# Patient Record
Sex: Male | Born: 1950
Health system: Southern US, Community
[De-identification: ages and names within clinical notes are randomized; demographics above are authoritative.]

## PROBLEM LIST (undated history)

## (undated) DIAGNOSIS — I639 Cerebral infarction, unspecified: Secondary | ICD-10-CM

## (undated) DIAGNOSIS — F419 Anxiety disorder, unspecified: Secondary | ICD-10-CM

## (undated) DIAGNOSIS — I10 Essential (primary) hypertension: Secondary | ICD-10-CM

## (undated) HISTORY — PX: APPENDECTOMY: SHX54

## (undated) HISTORY — PX: NASAL SINUS SURGERY: SHX719

## (undated) HISTORY — DX: Cerebral infarction, unspecified: I63.9

## (undated) HISTORY — DX: Anxiety disorder, unspecified: F41.9

---

## 1999-10-19 ENCOUNTER — Ambulatory Visit: Admission: RE | Admit: 1999-10-19 | Discharge: 1999-10-19 | Payer: Self-pay | Admitting: Internal Medicine

## 1999-10-19 ENCOUNTER — Encounter: Payer: Self-pay | Admitting: Pulmonary Disease

## 2002-07-13 ENCOUNTER — Encounter (INDEPENDENT_AMBULATORY_CARE_PROVIDER_SITE_OTHER): Payer: Self-pay | Admitting: *Deleted

## 2002-07-13 ENCOUNTER — Ambulatory Visit (HOSPITAL_COMMUNITY): Admission: RE | Admit: 2002-07-13 | Discharge: 2002-07-13 | Payer: Self-pay | Admitting: Gastroenterology

## 2007-10-24 ENCOUNTER — Ambulatory Visit: Payer: Self-pay | Admitting: Sports Medicine

## 2007-10-24 DIAGNOSIS — M2020 Hallux rigidus, unspecified foot: Secondary | ICD-10-CM

## 2007-10-24 DIAGNOSIS — M79609 Pain in unspecified limb: Secondary | ICD-10-CM

## 2007-10-25 ENCOUNTER — Encounter: Payer: Self-pay | Admitting: Sports Medicine

## 2007-12-01 ENCOUNTER — Ambulatory Visit: Payer: Self-pay | Admitting: Pulmonary Disease

## 2007-12-01 DIAGNOSIS — J309 Allergic rhinitis, unspecified: Secondary | ICD-10-CM | POA: Insufficient documentation

## 2007-12-01 DIAGNOSIS — G4733 Obstructive sleep apnea (adult) (pediatric): Secondary | ICD-10-CM

## 2007-12-01 DIAGNOSIS — R519 Headache, unspecified: Secondary | ICD-10-CM | POA: Insufficient documentation

## 2007-12-01 DIAGNOSIS — R51 Headache: Secondary | ICD-10-CM | POA: Insufficient documentation

## 2007-12-01 DIAGNOSIS — G4700 Insomnia, unspecified: Secondary | ICD-10-CM

## 2007-12-14 ENCOUNTER — Encounter: Admission: RE | Admit: 2007-12-14 | Discharge: 2007-12-14 | Payer: Self-pay | Admitting: Otolaryngology

## 2007-12-20 ENCOUNTER — Telehealth: Payer: Self-pay | Admitting: Pulmonary Disease

## 2008-01-02 ENCOUNTER — Telehealth (INDEPENDENT_AMBULATORY_CARE_PROVIDER_SITE_OTHER): Payer: Self-pay | Admitting: *Deleted

## 2008-01-02 ENCOUNTER — Encounter: Payer: Self-pay | Admitting: Pulmonary Disease

## 2008-02-02 ENCOUNTER — Ambulatory Visit: Payer: Self-pay | Admitting: Sports Medicine

## 2008-02-08 ENCOUNTER — Ambulatory Visit: Payer: Self-pay | Admitting: Pulmonary Disease

## 2008-02-19 ENCOUNTER — Telehealth: Payer: Self-pay | Admitting: Pulmonary Disease

## 2008-02-29 ENCOUNTER — Telehealth (INDEPENDENT_AMBULATORY_CARE_PROVIDER_SITE_OTHER): Payer: Self-pay | Admitting: *Deleted

## 2008-03-14 ENCOUNTER — Ambulatory Visit: Payer: Self-pay | Admitting: Pulmonary Disease

## 2008-03-28 ENCOUNTER — Telehealth (INDEPENDENT_AMBULATORY_CARE_PROVIDER_SITE_OTHER): Payer: Self-pay | Admitting: *Deleted

## 2008-04-15 ENCOUNTER — Telehealth (INDEPENDENT_AMBULATORY_CARE_PROVIDER_SITE_OTHER): Payer: Self-pay | Admitting: *Deleted

## 2008-04-16 ENCOUNTER — Telehealth (INDEPENDENT_AMBULATORY_CARE_PROVIDER_SITE_OTHER): Payer: Self-pay | Admitting: *Deleted

## 2008-05-09 ENCOUNTER — Ambulatory Visit: Payer: Self-pay | Admitting: Pulmonary Disease

## 2008-05-15 ENCOUNTER — Encounter (INDEPENDENT_AMBULATORY_CARE_PROVIDER_SITE_OTHER): Payer: Self-pay | Admitting: Otolaryngology

## 2008-05-15 ENCOUNTER — Ambulatory Visit (HOSPITAL_COMMUNITY): Admission: RE | Admit: 2008-05-15 | Discharge: 2008-05-16 | Payer: Self-pay | Admitting: Otolaryngology

## 2008-06-27 ENCOUNTER — Telehealth (INDEPENDENT_AMBULATORY_CARE_PROVIDER_SITE_OTHER): Payer: Self-pay | Admitting: *Deleted

## 2008-07-08 ENCOUNTER — Telehealth (INDEPENDENT_AMBULATORY_CARE_PROVIDER_SITE_OTHER): Payer: Self-pay | Admitting: *Deleted

## 2008-08-28 ENCOUNTER — Ambulatory Visit (HOSPITAL_BASED_OUTPATIENT_CLINIC_OR_DEPARTMENT_OTHER): Admission: RE | Admit: 2008-08-28 | Discharge: 2008-08-28 | Payer: Self-pay | Admitting: Otolaryngology

## 2008-08-31 ENCOUNTER — Ambulatory Visit: Payer: Self-pay | Admitting: Internal Medicine

## 2008-10-03 ENCOUNTER — Encounter: Payer: Self-pay | Admitting: Pulmonary Disease

## 2008-11-07 ENCOUNTER — Telehealth (INDEPENDENT_AMBULATORY_CARE_PROVIDER_SITE_OTHER): Payer: Self-pay | Admitting: *Deleted

## 2008-11-13 ENCOUNTER — Ambulatory Visit: Payer: Self-pay | Admitting: Pulmonary Disease

## 2008-11-27 ENCOUNTER — Encounter: Payer: Self-pay | Admitting: Pulmonary Disease

## 2008-12-24 ENCOUNTER — Encounter: Payer: Self-pay | Admitting: Pulmonary Disease

## 2011-01-19 NOTE — Op Note (Signed)
Omar Reed, Omar Reed                ACCOUNT NO.:  [id number]   MEDICAL RECORD NO.:  [id number]          PATIENT TYPE:  OIB   LOCATION:  2623                         FACILITY:  [facility]   PHYSICIAN:  Kinnie Scales. Annalee Genta, M.D.DATE OF BIRTH:  June 04, 1951   DATE OF PROCEDURE:  05/15/2008  DATE OF DISCHARGE:                               OPERATIVE REPORT   LOCATION:  [facility] Main OR.   PREOPERATIVE DIAGNOSES:  1. Chronic sinusitis.  2. Deviated nasal septum with nasal airway obstruction.  3. Inferior turbinate hypertrophy.   POSTOPERATIVE DIAGNOSES:  1. Chronic sinusitis.  2. Deviated nasal septum with nasal airway obstruction.  3. Inferior turbinate hypertrophy.   INDICATIONS FOR SURGERY:  1. Chronic sinusitis.  2. Deviated nasal septum with nasal airway obstruction.  3. Inferior turbinate hypertrophy.   SURGICAL PROCEDURES:  1. Bilateral endoscopic sinus surgery with computer-assisted      navigation (stealth format consisting of bilateral total      ethmoidectomy, bilateral maxillary antrostomy with removal of      diseased tissue and bilateral nasal frontal recess exploration.  2. Nasal septoplasty.  3. Bilateral inferior turbinate reduction.   SURGEON:  Kinnie Scales. Annalee Genta, MD   ANESTHESIA:  General endotracheal.   COMPLICATIONS:  None.   ESTIMATED BLOOD LOSS:  150 mL.   The patient was transferred from the operating room to recovery room in  stable condition.   BRIEF HISTORY:  [name] is a 60 year old white male who has been  followed in our office with a history of chronic sinusitis and  obstructive sleep apnea.  The patient had been previously treated with  multiple courses of antibiotics and had initial improvement after  treatment, but then shortly thereafter recurred with recurrent purulent  nasal drainage, headache, and severe and progressive nasal congestion.  CT scanning was performed.  The patient was found have a severely  deviated septum with  near complete right-sided nasal airway obstruction  and bilateral chronic-appearing sinonasal disease.  The patient was  counseled regarding surgical procedures for the above problems.  Given  his history of obstructive sleep apnea surgery was anticipated at [facility] Main OR with overnight observation in step-down for  postoperative sleep apnea monitoring.  The risks, benefits, and possible  complications of septoplasty, turbinate reduction, and bilateral  endoscopic sinus surgery were discussed in detail with the patient's and  his wife who understood and concurred with our plan for surgery, which  was scheduled as above.  Prior to surgical intervention, a stealth  formatted CT scan of the sinus was obtained for intraoperative computer-  assisted navigation and the stealth device was used throughout the  surgical procedure.   SURGICAL PROCEDURE:  The patient was brought to the operating room on  May 15, 2008 at [facility] Main OR, placed in supine  position on the operating table.  General endotracheal anesthesia was  established without difficulty.  When the patient was adequately  anesthetized, the patient was positioned on the operating table and  prepped and draped in the sterile fashion.  The patient's nose was  injected with a total of 12 mL of 1% lidocaine with 1:100,000 solution  of epinephrine which was injected in a submucosal fashion along the  lateral nasal wall, uncinate process, middle turbinate, inferior  turbinate, and nasal septum.  The patient's nose was then packed with  Afrin-soaked cottonoid pledgets, we had to place it for approximately 10  minutes to allow for vasoconstriction.  The stealth headgear was then  applied and anatomic surgical landmarks were identified confirmed using  the stealth device, which was used throughout the procedure.   Sinus surgery begun on the patient's left-hand side, packing was  removed, 0-degree nasal  endoscopy was performed.  The patient had a  large middle turbinate concha bullosa, which was resected by creating a  vertical incision in the anterior face of the middle turbinate, this was  carried anterior to posterior and the lateral aspect of the concha  bullosa was resected preserving the mucosa and middle turbinate  structure.  Attention was then turned to the ethmoid cavity where  anterior ethmoidectomy was performed by resecting the ethmoid bulla and  using a 0-degree telescope and a straight microdebrider for dissection  throughout the ethmoids.  Dissection was then carried posteriorly and  posterior ethmoid air cells were identified using a 45-degree telescope  and the curved microdebrider.  Dissection was then carried out along the  roof of the ethmoid from posterior to anterior.  The stealth navigation  tool was used throughout this portion of the procedure.  The nasal  frontal recess was identified and was completely occluded by underlying  ethmoid air cells and thickened diseased mucosa, this was resected with  through-cutting forceps and a curved microdebrider creating a widely  patent nasal frontal recess.  Ethmoid septations were resected and this  created a patent common ethmoid cavity.  Attention was then turned to  the lateral nasal wall where the natural ostium of the maxillary sinus  was identified, again this was completely occluded by diseased mucosa  and overlying uncinate, which was resected.  The natural ostium of the  maxillary sinus was then enlarged in anterior, inferior, and posterior  direction and enlarged maxillary sinus mucous retention cyst was then  resected with a curved microdebrider.   Attention was then turned to the patient's nasal septum.  The left  anterior hemitransfixion incision was created and a mucoperichondrial  flap was elevated from anterior to posterior along the patient's left-  hand side.  Bony cartilaginous junction was crossed in  the midline and  mucoperiosteal flap was elevated on the right, anterior and midseptal  cartilage was resected, morselized and returned to the mucoperichondrial  pocket at the conclusion of the surgical procedure.  Anterior, dorsal,  and columellar cartilage was not deviated and was not implemented during  the operation.  Posterior septal spurring and obstruction was then  dissected and removed with through-cutting forceps.  The patient had a  large right inferior septal spur, which was also resected after  elevating and preserving the overlying mucosa, it was brought the septum  to the midline.  After conclusion of the procedure, the morselized  cartilage was returned to the mucoperichondrial pocket and the flaps  were re-approximated with a 4-0 gut suture on a Keith needle in a  horizontal mattress fashion.  Bilateral Doyle nasal septal splints were  then placed after the application of Bactroban ointment over the  suturing position with a 3-0 Ethilon suture for stability.   With the septal surgery completed, right endoscopic sinus  surgery was  undertaken using a 0-degree telescope.  The middle turbinate was gently  medialized.  Because of the severely deviated septum, the entire middle  turbinate and right middle meatus were occluded with polypoid diseased  mucosa.  With the middle turbinate returned to a normal anatomic  position, anterior ethmoidectomy was performed with resection of the  ethmoid bulla.  Dissection was then carried from anterior to posterior  removing bony septations.  Posterior superior ethmoid cells were  identified and there was significant mucosal thickening in this area  consistent with chronic infection.  No pus was noted.  Dissection was  then carried from posterior to anterior along the roof of the ethmoid  sinus with a 45-degree telescope, curved microdebrider, and stealth  suction probe.  Nasal frontal recess was again occluded from below.  The  anterior  ethmoid air cells were resected.  Dissection was then carried  out into the nasal frontal recess preserving the mucosa by creating a  widely patent nasal frontal recess.  Again, the mucosa within the  frontal sinus was significantly thickened consistent with chronic  sinusitis, but there was no pus.  Attention was then turned to lateral  nasal wall where residual uncinate process was reflected anteriorly and  resected.  Natural ostium of the sinus was identified and then the sinus  ostium was enlarged in a posterior inferior direction.  Again within the  maxillary sinus, there was a relatively large mucous cyst, which was  resected creating a widely patent right maxillary sinus.   Attention was then turned to the inferior turbinates where bilateral  inferior turbinate reduction was performed with cautery set at 12 watts,  2 submucosal passes were made with bipolar intramural cautery.  With  soft tissue turbinates cauterized, turbinates were then outfractured.  Small anterior incisions were created, overlying mucosa was elevated,  and small amount turbinate bone was resected bilaterally creating a  widely patent nasal cavity.   The patient's nasal cavity was then thoroughly inspected, surgical  debris was resected, and the sinuses were irrigated.  There was minimal  active bleeding and sinuses were widely patent.  Bilateral Kennedy sinus  packs were then placed after the application of Bactroban ointment,  placed within the middle meatus and common ethmoid cavity bilaterally.  The patient's oral cavity and oropharynx were then irrigated and  suctioned and orogastric tube was passed.  Stomach contents were  aspirated.  The patient was awakened from his anesthetic, he was  extubated, and was transferred from the operating room to the recovery  room in stable condition.  There were no complications and blood loss  was approximately 150 mL.           ______________________________  Kinnie Scales. Annalee Genta, M.D.     DLS/MEDQ  D:  16/06/9603  T:  05/16/2008  Job:  [id number]

## 2011-01-19 NOTE — Procedures (Signed)
NAME:  Omar Reed, Omar Reed                ACCOUNT NO.:  [id number]   MEDICAL RECORD NO.:  [id number]          PATIENT TYPE:  OUT   LOCATION:  SLEEP CENTER                 [facility]ACILITY[facility]  [facility]   PHYSICIAN:  Clinton D. Maple Hudson, MD, FCCP, FACPDATE OF BIRTH:  09/25/1950   DATE OF STUDY:  08/28/2008                            NOCTURNAL POLYSOMNOGRAM   REFERRING PHYSICIAN:  Onalee Hua L. Annalee Genta, M.D.   INDICATION FOR STUDY:  Hypersomnia with sleep apnea.   EPWORTH SLEEPINESS SCORE:  Epworth sleepiness score 9/24, BMI 23.8,  weight 143 pounds, height 65 inches, and neck 13.5 inches.   MEDICATIONS:  Home medications are charted and reviewed.  This is a  postsurgical study.   SLEEP ARCHITECTURE:  Split study protocol.  During the diagnostic phase,  total sleep time 139 minutes with sleep efficiency 94.9%.  Stage I is  6.5%, stage II 91.8%, stage III absent.  REM 1.8% of total sleep time.  Sleep latency is 2 minutes.  REM latency 136 minutes.  Awake after sleep  onset 3 minutes.  Arousal index 27.1.  Bedtime medication included  Rozerem and clonazepam.   RESPIRATORY DATA:  Split study protocol.  Apnea-hypopnea index (AHI)  20.6 per hour.  A total of 48 events was scored including 5 obstructive  apneas and 43 hypopneas.  All events were recorded while supine.  REM  AHI 72.  CPAP was titrated to 10 CWP, AHI 0.9 per hour.  He wore a  medium ResMed Mirage Quattro full face mask with heated humidifier.   OXYGEN DATA:  Moderate snoring before CPAP with oxygen desaturation to a  nadir of 81%.  Mean oxygen saturation with CPAP was 94.5% on room air.   CARDIAC DATA:  Normal sinus rhythm.   MOVEMENT-PARASOMNIA:  No significant movement disturbance.  Bathroom x1.   IMPRESSIONS/RECOMMENDATIONS:  1. Moderate obstructive sleep apnea/hypopnea syndrome, AHI 20.6 per      hour.  Events appeared to be exclusively supine and substantially      associated with REM (REM AHI 72 per hour).  Moderate snoring with  oxygen desaturation to a nadir of 81%.  2. Successful continuous positive airway pressure titration to 10 CWP,      AHI 0.9 per hour.  He shows a medium [employer] full      face mask with heated humidifier.      Clinton D. Maple Hudson, MD, [facility], FACP  Diplomate, Biomedical engineer of Sleep Medicine  Electronically Signed     CDY/MEDQ  D:  09/01/2008 11:09:51  T:  09/02/2008 01:30:02  Job:  [id number]

## 2011-06-09 LAB — CBC
MCHC: 33.4
MCV: 88.5
Platelets: 188
RBC: 5.24
RDW: 13.6

## 2011-06-22 ENCOUNTER — Ambulatory Visit (INDEPENDENT_AMBULATORY_CARE_PROVIDER_SITE_OTHER): Payer: Self-pay | Admitting: Family Medicine

## 2011-06-22 DIAGNOSIS — E669 Obesity, unspecified: Secondary | ICD-10-CM

## 2011-07-27 ENCOUNTER — Ambulatory Visit (INDEPENDENT_AMBULATORY_CARE_PROVIDER_SITE_OTHER): Payer: Self-pay | Admitting: Family Medicine

## 2011-07-27 DIAGNOSIS — Z713 Dietary counseling and surveillance: Secondary | ICD-10-CM

## 2012-11-23 ENCOUNTER — Ambulatory Visit (INDEPENDENT_AMBULATORY_CARE_PROVIDER_SITE_OTHER): Payer: Commercial Managed Care - PPO | Admitting: Sports Medicine

## 2012-11-23 ENCOUNTER — Encounter: Payer: Self-pay | Admitting: Sports Medicine

## 2012-11-23 VITALS — BP 145/93 | HR 72 | Ht 64.8 in | Wt 158.0 lb

## 2012-11-23 DIAGNOSIS — M722 Plantar fascial fibromatosis: Secondary | ICD-10-CM | POA: Insufficient documentation

## 2012-11-23 DIAGNOSIS — S76319A Strain of muscle, fascia and tendon of the posterior muscle group at thigh level, unspecified thigh, initial encounter: Secondary | ICD-10-CM | POA: Insufficient documentation

## 2012-11-23 DIAGNOSIS — S76312A Strain of muscle, fascia and tendon of the posterior muscle group at thigh level, left thigh, initial encounter: Secondary | ICD-10-CM

## 2012-11-23 NOTE — Assessment & Plan Note (Signed)
Sports insoles with heel pads Arch straps Eccentric calf exercises Ice and massage Followup in 6 weeks

## 2012-11-23 NOTE — Assessment & Plan Note (Signed)
Plan for Eccentric hamstring exercises Followup in 4-6 weeks

## 2012-11-23 NOTE — Patient Instructions (Addendum)
Thank you for coming in today. Do the heel raises. Go down slowly.  Do the pull toe back stretch with ice massage.  Do the cam stretcher.  Use meloxicam as needed daily.   For the hamstring.  3 exercises.  Extender.  Glide Diver  30 reps 2x daily.   Return in 4-6 weeks.

## 2012-11-23 NOTE — Progress Notes (Signed)
Omar Reed is a 62 y.o. male who presents to [facility] today for left plantar fasciitis and left hamstring tendinitis.  Patient has had symptomatic plantar fasciitis of his left foot for approximately 6 months. He is currently wearing custom-made rigid orthotics which are painful. He is no longer doing any activities such as running or jumping however he would like to continue treadmill walking and elliptical. He notes pain in the plantar calcaneus worse with activity better with rest. He denies any radiating pain weakness or numbness. He denies any injury.   Additionally he notes posterior distal hamstring pain. This is a been evaluated by Dr. Thurston Hole at [facility].  He's had an MRI which is normal. Is not currently receiving any therapy for his hamstring. This is mildly persistent worse with activity better with rest.    PMH reviewed. Sleep apnea History  Substance Use Topics  . Smoking status: Never Smoker   . Smokeless tobacco: Never Used  . Alcohol Use: Not on file   ROS as above otherwise neg   Exam:  BP 145/93  Pulse 72  Ht 5' 4.8" (1.646 m)  Wt 158 lb (71.668 kg)  BMI 26.45 kg/m2 Gen: Well NAD MSK: Feet bilaterally. Normal-appearing no significant abnormalities. Mild pronation. Tender palpation plantar calcaneus left worse than right. Motion pulses capillary refill and sensation are intact.  Left hamstring. Tender palpation distal hamstring.  Normal knee motion and strength to flexion.

## 2013-01-04 ENCOUNTER — Ambulatory Visit (INDEPENDENT_AMBULATORY_CARE_PROVIDER_SITE_OTHER): Payer: Commercial Managed Care - PPO | Admitting: Sports Medicine

## 2013-01-04 VITALS — BP 147/96 | Ht 64.0 in | Wt 158.0 lb

## 2013-01-04 DIAGNOSIS — M722 Plantar fascial fibromatosis: Secondary | ICD-10-CM

## 2013-01-04 DIAGNOSIS — M79662 Pain in left lower leg: Secondary | ICD-10-CM

## 2013-01-04 DIAGNOSIS — M79609 Pain in unspecified limb: Secondary | ICD-10-CM

## 2013-01-04 DIAGNOSIS — M79669 Pain in unspecified lower leg: Secondary | ICD-10-CM | POA: Insufficient documentation

## 2013-01-04 NOTE — Assessment & Plan Note (Signed)
I think he will improve with this regimen so I would like him to keep up the home exercise program  Keep using the sports insoles but we can try an arch pad or the heel cups which ever feels better  I advised him that he can continue exercising but will have to pursue some pain to do this

## 2013-01-04 NOTE — Progress Notes (Signed)
Patient ID: Omar Reed, male   DOB: 13-Oct-1950, 62 y.o.   MRN: [id number]   F/u of Lt plantar fasciitis which feels slightly worse - actually better at rest but since our last visit he has felt confident to go back to activity Has increased activity since last visit- walks significantly 5 miles at [facility] or treadmill. Has some pain at the end of walks and afterwards. Has not needed to take meloxicam frequently.  Does home exercises consistently.  The sports insoles along with heel cups of been very helpful in relieving his pain With these he feels that he can walk without much of a limp   Noticed a soft tissue mass on anterior left shin about 2 weeks ago    Physical exam:  NAD  No tenderness to lt calf ttp over insertion of lt PF.  No swelling Normal arch shape  Lt anterior shin- lateral to mid line 2x1 cm soft tissue mass Mass is over anterior tib muscle belly, does not move freely with plantar and dorsi flexion  Musculoskeletal ultrasound Plantar fascia is not that thick at 0.51 cm However there is a small tear noted only at the medial insertion of the plantar fascia to the left calcaneus  Scan of the mass over his anterior shin shows that this looks consistent with a fascial herniation There is a soft tissue area that extends out to the fascia and has a mild hypoechoic rim around it but it appears the same composition as the normal tissue in the area

## 2013-01-04 NOTE — Patient Instructions (Addendum)
Please do suggested plantar fascia exercises daily  Try heel pads in shoes  Wear compression sleeve for activity and for 30 min to 1 hour after- please do not sleep in sleeve For fascial herniation  Please follow up in 2 months  Thank you for seeing [place] today!

## 2013-01-04 NOTE — Assessment & Plan Note (Signed)
I suggested that he use compression for the next 2 months  Been aware of any changes to suggest that this is growing or changing in texture  Walking exercises may be helpful in this gradually resolving  Repeat the scan in 2 months

## 2013-03-24 ENCOUNTER — Ambulatory Visit (INDEPENDENT_AMBULATORY_CARE_PROVIDER_SITE_OTHER): Payer: Commercial Managed Care - PPO | Admitting: Family Medicine

## 2013-03-24 ENCOUNTER — Ambulatory Visit: Payer: Commercial Managed Care - PPO

## 2013-03-24 ENCOUNTER — Telehealth: Payer: Self-pay

## 2013-03-24 VITALS — BP 144/94 | HR 95 | Temp 98.1°F | Resp 16 | Ht 65.0 in | Wt 162.0 lb

## 2013-03-24 DIAGNOSIS — B0230 Zoster ocular disease, unspecified: Secondary | ICD-10-CM

## 2013-03-24 DIAGNOSIS — J329 Chronic sinusitis, unspecified: Secondary | ICD-10-CM

## 2013-03-24 DIAGNOSIS — H5711 Ocular pain, right eye: Secondary | ICD-10-CM

## 2013-03-24 DIAGNOSIS — B0239 Other herpes zoster eye disease: Secondary | ICD-10-CM

## 2013-03-24 DIAGNOSIS — H5710 Ocular pain, unspecified eye: Secondary | ICD-10-CM

## 2013-03-24 LAB — POCT CBC
HCT, POC: 49.9 % (ref 43.5–53.7)
Hemoglobin: 16.4 g/dL (ref 14.1–18.1)
MCH, POC: 29.8 pg (ref 27–31.2)
MPV: 10.3 fL (ref 0–99.8)
POC MID %: 5.1 %M (ref 0–12)
RBC: 5.5 M/uL (ref 4.69–6.13)
WBC: 8.7 10*3/uL (ref 4.6–10.2)

## 2013-03-24 MED ORDER — VALACYCLOVIR HCL 1 G PO TABS: 1000.0000 mg | ORAL_TABLET | Freq: Three times a day (TID) | ORAL | Status: DC

## 2013-03-24 NOTE — Patient Instructions (Signed)
Shingles Shingles (herpes zoster) is an infection that is caused by the same virus that causes chickenpox (varicella). The infection causes a painful skin rash and fluid-filled blisters, which eventually break open, crust over, and heal. It may occur in any area of the body, but it usually affects only one side of the body or face. The pain of shingles usually lasts about 1 month. However, some people with shingles may develop long-term (chronic) pain in the affected area of the body. Shingles often occurs many years after the person had chickenpox. It is more common:  In people older than 50 years.  In people with weakened immune systems, such as those with HIV, AIDS, or cancer.  In people taking medicines that weaken the immune system, such as transplant medicines.  In people under great stress. CAUSES  Shingles is caused by the varicella zoster virus (VZV), which also causes chickenpox. After a person is infected with the virus, it can remain in the person's body for years in an inactive state (dormant). To cause shingles, the virus reactivates and breaks out as an infection in a nerve root. The virus can be spread from person to person (contagious) through contact with open blisters of the shingles rash. It will only spread to people who have not had chickenpox. When these people are exposed to the virus, they may develop chickenpox. They will not develop shingles. Once the blisters scab over, the person is no longer contagious and cannot spread the virus to others. SYMPTOMS  Shingles shows up in stages. The initial symptoms may be pain, itching, and tingling in an area of the skin. This pain is usually described as burning, stabbing, or throbbing.In a few days or weeks, a painful red rash will appear in the area where the pain, itching, and tingling were felt. The rash is usually on one side of the body in a band or belt-like pattern. Then, the rash usually turns into fluid-filled blisters. They  will scab over and dry up in approximately 2 3 weeks. Flu-like symptoms may also occur with the initial symptoms, the rash, or the blisters. These may include:  Fever.  Chills.  Headache.  Upset stomach. DIAGNOSIS  Your caregiver will perform a skin exam to diagnose shingles. Skin scrapings or fluid samples may also be taken from the blisters. This sample will be examined under a microscope or sent to a lab for further testing. TREATMENT  There is no specific cure for shingles. Your caregiver will likely prescribe medicines to help you manage the pain, recover faster, and avoid long-term problems. This may include antiviral drugs, anti-inflammatory drugs, and pain medicines. HOME CARE INSTRUCTIONS   Take a cool bath or apply cool compresses to the area of the rash or blisters as directed. This may help with the pain and itching.   Only take over-the-counter or prescription medicines as directed by your caregiver.   Rest as directed by your caregiver.  Keep your rash and blisters clean with mild soap and cool water or as directed by your caregiver.  Do not pick your blisters or scratch your rash. Apply an anti-itch cream or numbing creams to the affected area as directed by your caregiver.  Keep your shingles rash covered with a loose bandage (dressing).  Avoid skin contact with:  Babies.   Pregnant women.   Children with eczema.   Elderly people with transplants.   People with chronic illnesses, such as leukemia or AIDS.   Wear loose-fitting clothing to help ease   the pain of material rubbing against the rash.  Keep all follow-up appointments with your caregiver.If the area involved is on your face, you may receive a referral for follow-up to a specialist, such as an eye doctor (ophthalmologist) or an ear, nose, and throat (ENT) doctor. Keeping all follow-up appointments will help you avoid eye complications, chronic pain, or disability.  SEEK IMMEDIATE MEDICAL  CARE IF:   You have facial pain, pain around the eye area, or loss of feeling on one side of your face.  You have ear pain or ringing in your ear.  You have loss of taste.  Your pain is not relieved with prescribed medicines.   Your redness or swelling spreads.   You have more pain and swelling.  Your condition is worsening or has changed.   You have a feveror persistent symptoms for more than 2 3 days.  You have a fever and your symptoms suddenly get worse. MAKE SURE YOU:  Understand these instructions.  Will watch your condition.  Will get help right away if you are not doing well or get worse. Document Released: 08/23/2005 Document Revised: 05/17/2012 Document Reviewed: 04/06/2012 [employer] Patient Information 2014 [employer].  

## 2013-03-24 NOTE — Progress Notes (Signed)
Subjective:    Patient ID: Omar Reed, male    DOB: 06-Jun-1951, 62 y.o.   MRN: [id number] Chief Complaint  Patient presents with  . Eye Drainage    * couple days  . Eye Pain    Rt eye- Lt eye hurt some last night  . Headache    * couple days- Pt took 6 Tylenol yesterday and it did not help the headache at all  . Chills    HPI  Has pain in his eye - thinking maybe sinuses and does have h/o sinus issue but none in the past 5 yrs since sinus surgery. No signs of pink eye.  Sxs started Thurs night with severe right eye pain - felt like he was punched in the eye - feels like pain, not presure. Stayed in Rt eye, then last night pain in both eyes but today pain just back in Rt eye again. Tried asa w/o any relief.  Couldn't even sleep on right side of face as it was so sensitive.  Feels like pain is really in eye - occ radiates to forehead and temple. A lot tearing thurs night but none since then.  A little bit of nasal rhinits Thurs night.  Feels like a headache behind the right eye.  No vision changes at all, no light sensitivity. The eye looks the same to him.  Does a h/o migraines but hasn't had for years as he takes norvasc which has prevented them (from a migraine clinic many yrs ago.) Has had shingles on his torso in the past. His mother had shingles in her eye.  Past Medical History  Diagnosis Date  . Anxiety    Current Outpatient Prescriptions on File Prior to Visit  Medication Sig Dispense Refill  . amLODipine (NORVASC) 5 MG tablet Take 5 mg by mouth daily.      . meloxicam (MOBIC) 15 MG tablet Take 15 mg by mouth daily as needed.      Marland Kitchen omeprazole (PRILOSEC) 20 MG capsule Take 20 mg by mouth daily.      . imiquimod (ALDARA) 5 % cream daily.      . Salicylic Acid 26 % LIQD Apply topically daily.       No current facility-administered medications on file prior to visit.   No Known Allergies  Review of Systems  Constitutional: Positive for chills. Negative for fever,  diaphoresis, activity change and appetite change.  HENT: Positive for sinus pressure. Negative for ear pain, congestion, sore throat, facial swelling, rhinorrhea, neck pain, neck stiffness, postnasal drip and tinnitus.   Eyes: Positive for pain. Negative for photophobia, discharge, redness, itching and visual disturbance.  Skin: Negative for rash.  Neurological: Positive for headaches. Negative for dizziness, seizures, facial asymmetry, speech difficulty and light-headedness.  Hematological: Negative for adenopathy. Does not bruise/bleed easily.  Psychiatric/Behavioral: Positive for sleep disturbance.      BP 144/94  Pulse 95  Temp(Src) 98.1 F (36.7 C) (Oral)  Resp 16  Ht 5\' 5"  (1.651 m)  Wt 162 lb (73.483 kg)  BMI 26.96 kg/m2  SpO2 97% Objective:   Physical Exam  Constitutional: He is oriented to person, place, and time. He appears well-developed and well-nourished. No distress.  HENT:  Head: Normocephalic and atraumatic.    Right Ear: Tympanic membrane, external ear and ear canal normal.  Left Ear: Tympanic membrane, external ear and ear canal normal.  Nose: Nose normal.  Mouth/Throat: Oropharynx is clear and moist and mucous membranes are normal. No oropharyngeal  exudate.  Tenderness to light touch and pressure above and below eye in right V1 area. No tenderness over bilateral temple or temporal arteries.  Eyes: Conjunctivae, EOM and lids are normal. Pupils are equal, round, and reactive to light. Right eye exhibits no chemosis, no discharge and no exudate. Left eye exhibits no chemosis, no discharge and no exudate. No scleral icterus.  Fundoscopic exam:      The right eye shows no arteriolar narrowing, no AV nicking, no exudate, no hemorrhage and no papilledema.       The left eye shows no arteriolar narrowing, no AV nicking, no exudate, no hemorrhage and no papilledema.  Right eye with fluorescein stain viewed under black light without any specific areas or patterns of uptake  seen. No photosensitivity No sig pain with globus pressure.  Neck: Normal range of motion. Neck supple. No thyromegaly present.  Cardiovascular: Normal rate, regular rhythm, normal heart sounds and intact distal pulses.   Pulmonary/Chest: Effort normal and breath sounds normal. No respiratory distress.  Musculoskeletal: He exhibits no edema.  Lymphadenopathy:    He has no cervical adenopathy.  Neurological: He is alert and oriented to person, place, and time. He has normal strength. He displays no atrophy. No cranial nerve deficit or sensory deficit. He exhibits normal muscle tone.  Skin: Skin is warm and dry. No lesion and no rash noted. He is not diaphoretic. No erythema.  Psychiatric: He has a normal mood and affect. His behavior is normal.   Results for orders placed in visit on 03/24/13  POCT CBC      Result Value Range   WBC 8.7  4.6 - 10.2 K/uL   Lymph, poc 2.3  0.6 - 3.4   POC LYMPH PERCENT 25.9  10 - 50 %L   MID (cbc) 0.4  0 - 0.9   POC MID % 5.1  0 - 12 %M   POC Granulocyte 6.0  2 - 6.9   Granulocyte percent 69.0  37 - 80 %G   RBC 5.50  4.69 - 6.13 M/uL   Hemoglobin 16.4  14.1 - 18.1 g/dL   HCT, POC 45.4  09.8 - 53.7 %   MCV 90.8  80 - 97 fL   MCH, POC 29.8  27 - 31.2 pg   MCHC 32.9  31.8 - 35.4 g/dL   RDW, POC 11.9     Platelet Count, POC 253  142 - 424 K/uL   MPV 10.3  0 - 99.8 fL      [facility] reading (PRIMARY) by  Dr. Clelia Croft. Sinus series: Air fluid level noted in lower left maxillary sinus. No sig other abnormality.  RADIOLOGY OVERREAD: normal sinuses. Assessment & Plan:  Eye pain, right - Plan: DG Sinuses Complete, POCT CBC, POCT SEDIMENTATION RATE, C-reactive protein  Herpes zoster ophthalmicus  Pt was also evaluated by Dr. Milus Glazier who confirmed nml fundoscopic exam.  Suspect early case of shingles due to extreme skin sensitivity above and below the eye as well as the gritty sensation int he eye - no lesions apparent yet.  No dendrites seen on staining of the  eye. Start high dose valtrex immed. Pt may need something additional for pain in which case we will try some temporary vicodin - esp to help sleep at night.  Make an appt w/ his optho - Dr. Emily Filbert - asap - early this wk. If any additional sxs dev or any changes in vision, RTC immed for further eval.  Meds ordered this encounter  Medications  . DISCONTD: valACYclovir (VALTREX) 1000 MG tablet    Sig: Take 1 tablet (1,000 mg total) by mouth 3 (three) times daily.    Dispense:  30 tablet    Refill:  0  . valACYclovir (VALTREX) 1000 MG tablet    Sig: Take 1 tablet (1,000 mg total) by mouth 3 (three) times daily.    Dispense:  30 tablet    Refill:  0

## 2013-03-24 NOTE — Telephone Encounter (Signed)
Patient was seen today by Dr. Clelia Croft. Patient would like a prescription for Vicodin.  [phone]

## 2013-03-26 NOTE — Telephone Encounter (Signed)
Yes, please call in pt hydrocodone/APAP 5/325 mg 1 tab po q6 hrs prn pain disp 30 no refills.

## 2013-03-27 NOTE — Telephone Encounter (Signed)
Called in Rx and notified pt on VM. 

## 2013-06-11 ENCOUNTER — Ambulatory Visit: Payer: Commercial Managed Care - PPO | Admitting: Family Medicine

## 2013-06-25 ENCOUNTER — Ambulatory Visit (INDEPENDENT_AMBULATORY_CARE_PROVIDER_SITE_OTHER): Payer: Commercial Managed Care - PPO | Admitting: Family Medicine

## 2013-06-25 ENCOUNTER — Encounter: Payer: Self-pay | Admitting: Family Medicine

## 2013-06-25 VITALS — Ht 64.5 in | Wt 161.6 lb

## 2013-06-25 DIAGNOSIS — E663 Overweight: Secondary | ICD-10-CM | POA: Insufficient documentation

## 2013-06-25 DIAGNOSIS — E785 Hyperlipidemia, unspecified: Secondary | ICD-10-CM | POA: Insufficient documentation

## 2013-06-25 NOTE — Patient Instructions (Addendum)
-   Eat at least 3 meals and 1-2 snacks per day.  Aim for no more than 5 hours between eating.  - Breakfast within the first hour of getting up.  Continue using your [place] yogurt with 1-2 fruit servings (& you may want to add 1-2 tbsp unsalted nuts/seeds).     The best fruit will be low-glycemic, i.e., any berries, peach, plum, apple, cantaloupe. If you use banana, use 1/2 at a time.    - Obtain twice as many veg's as protein or carbohydrate foods for both lunch and dinner.     - Limit starchy foods to two per meal and one per snack.  One portion = 1 slice of bread, 1/2 cup of a "scoopable carb," or 15 grams of carb. - In addition to your 3 X wk exercise at the gym, walk at least 30 min 3 X wk.

## 2013-06-25 NOTE — Progress Notes (Signed)
Medical Nutrition Therapy:  Appt start time: 1000 end time:  1100.  Assessment:  Primary concerns today: Weight management and lipids management.  Jarrah has started seeing a Systems analyst Milta Deiters), and paying more attn to his diet, trying to lose weight and manage his lipids.  His BG has also been high (~99) at times.  He would like to weigh <140 lb, and to feel more in control of his food choices.   Usual eating pattern includes 2 meals and 2 snacks per day.  This eating pattern is relatively new for Jalil, and although he is losing weight, it is a struggle for him.  He has never been a regular breakfast eater.  Saban has been sedentary for many years, but is now retired, and motivated to make changes needed to get healthier and more fit.  Usual physical activity includes 30 min personal trainer once a week, and working out on his own 2 X wk usually 30-45 min, and he walks 60-75 min 1-2 X wk.    Frequent foods include skim milk, orange juice, hot tea, large portions of fruit (bkfast), salad (dinner), blintzes w/ cottage cheese 2 x wk (evening snack).  Avoided foods include most red meat.    Travelled yesterday, so instead of 24-hr recall, he gave me a typical day: (Up at 9 AM) B ( AM)-   water Snk ( AM)-   water L (12 PM)-  Plain [place] yogurt, 3-5 fruits, water Snk (2:30)-  Apple or other fruit D (4:30 PM)-  Large salad w/ fish or chx Snk (6 PM)-  3 blintzes a couple X wk  Progress Towards Goal(s):  In progress.   Nutritional Diagnosis:  NI-5.8.2 Excessive carbohydrate intake As related to fruit and juice.  As evidenced by usual breakfast of 3-5 fruits plus fruit juice.    Intervention:  Nutrition education.  Monitoring/Evaluation:  Dietary intake, exercise, and body weight in 5 week(s).

## 2013-07-26 ENCOUNTER — Ambulatory Visit (INDEPENDENT_AMBULATORY_CARE_PROVIDER_SITE_OTHER): Payer: Commercial Managed Care - PPO | Admitting: Family Medicine

## 2013-07-26 ENCOUNTER — Encounter: Payer: Self-pay | Admitting: Family Medicine

## 2013-07-26 VITALS — Ht 64.5 in | Wt 156.0 lb

## 2013-07-26 DIAGNOSIS — E785 Hyperlipidemia, unspecified: Secondary | ICD-10-CM

## 2013-07-26 DIAGNOSIS — E663 Overweight: Secondary | ICD-10-CM

## 2013-07-26 NOTE — Progress Notes (Signed)
Medical Nutrition Therapy:  Appt start time: 1530 end time:  1630.  Assessment:  Primary concerns today: Weight management and lipids management.  Omar Reed has been enjoying cooking some, which is all new to him.  He even made his own salad dressing this week.  He seems highly motivated about continuing to make changes and about learning to eat new foods.  His weight is down 5.6 lb since a month ago.  He continues to work out with his Systems analyst, Airline pilot, and is walking on non-workout days.    24-hr recall:  (Up at 8 AM) B (8:30)-  4 c water  B (9:30 AM)- 6 oz plain [place] yogurt, 1/2 banana, 4 c grapes, strawberries, blackberries, blueberries, 1 tbsp nuts Snk ( AM)-   L ( PM)-  1 c carrots, 1 c celery, 1/4 c hummus, water Snk ( PM)-  Large salad, 2 tbsp dressing   Water, more vegetables D (7 PM)-  Large salad, dressing, black beans, salmon,  Snk (9 PM)-  2 c fruit   Progress Towards Goal(s):  In progress.   Nutritional Diagnosis:  Some progress noted on NI-5.8.2 Excessive carbohydrate intake As related to fruit and juice.  As evidenced by ommission of fruit juice from breakfast.    Intervention:  Nutrition education.  Monitoring/Evaluation:  Dietary intake, exercise, and body weight in 5 week(s).

## 2013-07-26 NOTE — Patient Instructions (Addendum)
  Diet Recommendations for Diabetes   Starchy (carb) foods include: Bread, rice, pasta, potatoes, corn, crackers, bagels, muffins, all baked goods.  (Fruits, milk, and yogurt also have carbohydrate, but most of these foods will not spike your blood sugar as the starchy foods will.)  A few fruits do cause high blood sugars; use small portions of bananas (limit to 1/2 at a time), grapes, and tropical fruits.    Protein foods include: Meat, fish, poultry, eggs, dairy foods, and beans such as pinto and kidney beans (beans also provide carbohydrate).   Limit starchy foods to TWO per meal and ONE per snack. ONE portion of a starchy  food is equal to the following:   - ONE slice of bread (or its equivalent, such as half of a hamburger bun) = 1 ounce.  - 1/2 cup of a "scoopable" starchy food such as potatoes or rice.   - 15 grams of carbohydrate as shown on food label.  Both lunch and dinner should include a protein food, a carb food, and vegetables.   - Obtain twice as many veg's as protein or carbohydrate foods for both lunch and dinner.   - Fresh or frozen veg's are best.   - Try to keep frozen veg's on hand for a quick vegetable serving.     - Book recommendation: "Eating on the Wild Side" by Mardene Speak.   - Another starchy food that is highly nutritious is roasted root vegetables (garlic, onion, carrots, [purple] sweet potatoes, beets).   - Steel-cut oats:  Find at [employer] (or other stores):  Add cinnamon, dried fruit, a small amount brown sugar.  Try cooking with plenty of water, then upon serving, add 1/3 cup of powdered milk to each serving (= 8 g protein, & ~300 mg calcium).   - [employer] 365 brand no salt added beans.   - Try 1/2 tbsp of flax seed in yogurt or in cereal.   - Follow-up appt:  Jan 6 at 3:30 PM.

## 2013-09-11 ENCOUNTER — Ambulatory Visit: Payer: Commercial Managed Care - PPO | Admitting: Family Medicine

## 2013-09-11 ENCOUNTER — Encounter: Payer: Self-pay | Admitting: Family Medicine

## 2013-09-11 NOTE — Progress Notes (Signed)
Patient ID: Omar Reed, male   DOB: Dec 13, 1950, 63 y.o.   MRN: [id number] Dinh was not surprised to see a 1-pound wt gain since the holidays.  He is back to his exercise routine and his personal trainer (1 X wk), however, and has continued learning cooking skills, preparing a lot of his own foods.  He has tried many new foods, and is consuming many more veg's than in the past.  Hopes to sign up for the Weigh to Wellness II class offered Feb 24.

## 2014-07-15 ENCOUNTER — Encounter (INDEPENDENT_AMBULATORY_CARE_PROVIDER_SITE_OTHER): Payer: Self-pay

## 2014-07-15 ENCOUNTER — Ambulatory Visit (INDEPENDENT_AMBULATORY_CARE_PROVIDER_SITE_OTHER): Payer: Commercial Managed Care - PPO | Admitting: Pulmonary Disease

## 2014-07-15 ENCOUNTER — Encounter: Payer: Self-pay | Admitting: Pulmonary Disease

## 2014-07-15 VITALS — BP 144/80 | HR 80 | Temp 98.0°F | Ht 64.0 in | Wt 151.0 lb

## 2014-07-15 DIAGNOSIS — G4733 Obstructive sleep apnea (adult) (pediatric): Secondary | ICD-10-CM

## 2014-07-15 NOTE — Assessment & Plan Note (Signed)
The patient has a history of moderate obstructive sleep apnea, and has been on C Pap since 2011. He stopped using his device months ago because of a dry mouth, and also an unusual taste coming from his device. He has spoken with his dentist, who has raised the possibility of using a dental appliance for treatment. He and I talked about this years ago. At this point, I would like to make sure that we are not dealing with more significant sleep apnea that will not be completely treated by a dental appliance. If he has more moderate to severe disease, he should definitely stay on C Pap for treatment. If he has more mild to moderate disease, he can consider a dental appliance. The patient is agreeable to this approach.

## 2014-07-15 NOTE — Patient Instructions (Signed)
Will do home sleep testing to get an idea of where you stand with your sleep apnea.  We can then make a decision about the best treatment for you. Will call you with the results.

## 2014-07-15 NOTE — Progress Notes (Signed)
Subjective:    Patient ID: Omar Reed, male    DOB: 1950-12-14, 63 y.o.   MRN: [id number]  HPI The patient is a 63 year old male who I've been asked to see for management of obstructive sleep apnea. I have seen him in the distant past, where he was diagnosed with moderate OSA in 2009. He had nasal surgery with otolaryngology, and a follow-up study showed an AHI of 21 events per hour. He was started on CPAP at that time, and has been wearing up until a few months ago. He feels that he did well with CPAP, but it is very inconvenient. He stopped using most recently because of a dry mouth, and an unusual taste developing in his mouth after using it. Since being off C Pap, he has had more frequent awakenings during the night, and does not feel as rested in the mornings upon arising. Despite this, he feels satisfied with his daytime alertness. The patient has lost significant weight over the last 2 years, and his Epworth score today is 0.   Sleep Questionnaire What time do you typically go to bed?( Between what hours) 11:30 PM 11:30 PM at 1412 on 07/15/14 by Inge Rise, CMA How long does it take you to fall asleep? few minutes few minutes at 1412 on 07/15/14 by Inge Rise, CMA How many times during the night do you wake up? 2 2 at 1412 on 07/15/14 by Inge Rise, CMA What time do you get out of bed to start your day? 0830 0830 at 1412 on 07/15/14 by Inge Rise, CMA Do you drive or operate heavy machinery in your occupation? No No at 1412 on 07/15/14 by Inge Rise, CMA How much has your weight changed (up or down) over the past two years? (In pounds) 18 lb (8.165 kg) 18 lb (8.165 kg) at 1412 on 07/15/14 by Inge Rise, CMA Have you ever had a sleep study before? Yes Yes at 1412 on 07/15/14 by Inge Rise, CMA If yes, location of study? [facility] [place] at 1412 on 07/15/14 by Inge Rise, CMA If yes, date of study? 2010 2010 at 1412 on 07/15/14 by Inge Rise, CMA Do you  currently use CPAP? Yes Yes at 1412 on 07/15/14 by Inge Rise, CMA If so, what pressure? 9 9 at 1412 on 07/15/14 by Inge Rise, CMA Do you wear oxygen at any time? No No at 1412 on 07/15/14 by Inge Rise, CMA   Review of Systems  Constitutional: Negative for fever and unexpected weight change.  HENT: Positive for congestion. Negative for dental problem, ear pain, nosebleeds, postnasal drip, rhinorrhea, sinus pressure, sneezing, sore throat and trouble swallowing.   Eyes: Negative for redness and itching.  Respiratory: Negative for cough, chest tightness, shortness of breath and wheezing.   Cardiovascular: Negative for palpitations and leg swelling.  Gastrointestinal: Negative for nausea and vomiting.  Genitourinary: Negative for dysuria.  Musculoskeletal: Negative for joint swelling.  Skin: Negative for rash.  Neurological: Negative for headaches.  Hematological: Does not bruise/bleed easily.  Psychiatric/Behavioral: Negative for dysphoric mood. The patient is nervous/anxious.        Objective:   Physical Exam Constitutional:  Well developed, no acute distress  HENT:  Nares patent without discharge  Oropharynx without exudate, palate and uvula are elongated.  +overbite.  Eyes:  Perrla, eomi, no scleral icterus  Neck:  No JVD, no TMG  Cardiovascular:  Normal rate, regular rhythm, no  rubs or gallops.  No murmurs        Intact distal pulses  Pulmonary :  Normal breath sounds, no stridor or respiratory distress   No rales, rhonchi, or wheezing  Abdominal:  Soft, nondistended, bowel sounds present.  No tenderness noted.   Musculoskeletal:  No lower extremity edema noted.  Lymph Nodes:  No cervical lymphadenopathy noted  Skin:  No cyanosis noted  Neurologic:  Alert, appropriate, moves all 4 extremities without obvious deficit.         Assessment & Plan:

## 2014-10-01 ENCOUNTER — Other Ambulatory Visit: Payer: Self-pay | Admitting: Pulmonary Disease

## 2014-10-01 DIAGNOSIS — G4733 Obstructive sleep apnea (adult) (pediatric): Secondary | ICD-10-CM

## 2014-12-05 ENCOUNTER — Ambulatory Visit (HOSPITAL_BASED_OUTPATIENT_CLINIC_OR_DEPARTMENT_OTHER): Payer: Commercial Managed Care - PPO | Attending: Pulmonary Disease | Admitting: Radiology

## 2014-12-05 VITALS — Ht 64.0 in | Wt 146.0 lb

## 2014-12-05 DIAGNOSIS — G4710 Hypersomnia, unspecified: Secondary | ICD-10-CM | POA: Diagnosis present

## 2014-12-05 DIAGNOSIS — G4733 Obstructive sleep apnea (adult) (pediatric): Secondary | ICD-10-CM | POA: Diagnosis not present

## 2014-12-11 ENCOUNTER — Telehealth: Payer: Self-pay | Admitting: Pulmonary Disease

## 2014-12-11 DIAGNOSIS — G4733 Obstructive sleep apnea (adult) (pediatric): Secondary | ICD-10-CM | POA: Diagnosis not present

## 2014-12-11 NOTE — Telephone Encounter (Signed)
Let pt know that he has very mild sleep apnea by his sleep study, and if he wished to consider a dental appliance, would be happy to refer him.   We typically use an orthodontist here in town who specializes in this and does a very good job.  Let me know if he wishes a referral .

## 2014-12-11 NOTE — Sleep Study (Signed)
   NAME: Omar Reed DATE OF BIRTH:  March 05, 1951 MEDICAL RECORD NUMBER [id number]  LOCATION: [facility]  PHYSICIAN: [place] OF STUDY: 12/05/2014  SLEEP STUDY TYPE: Nocturnal Polysomnogram               REFERRING PHYSICIAN: Clance, Armando Reichert, MD  INDICATION FOR STUDY: Hypersomnia with sleep apnea  EPWORTH SLEEPINESS SCORE:  9 HEIGHT: 5\' 4"  (162.6 cm)  WEIGHT: 146 lb (66.225 kg)    Body mass index is 25.05 kg/(m^2).  NECK SIZE: 13.5 in.  MEDICATIONS: Reviewed in the sleep record  SLEEP ARCHITECTURE: The patient had a total sleep time of 359 minutes with very little slow-wave sleep and only 51 minutes of REM. Sleep onset latency was normal, as was REM onset. Sleep efficiency was mildly reduced.  RESPIRATORY DATA: The patient was found to have 1 apnea and 26 obstructive hypopneas, giving him an AHI of only 5 events per hour. The events occurred all in the supine position, and did show increased frequency during REM. Moderate snoring was noted throughout.  OXYGEN DATA: The patient had transient oxygen desaturation as low as 86% during the night.  CARDIAC DATA: Occasional PAC noted  MOVEMENT/PARASOMNIA: No clinically significant limb movements or other abnormal behaviors were seen.  IMPRESSION/ RECOMMENDATION:    1) minimal obstructive sleep apnea/hypopnea syndrome, with an AHI of 5 events per hour and oxygen desaturation as low as 86%. The decision to treat this degree of sleep apnea should depend upon its impact to the patient's quality of life. Clinical correlation is suggested.  2) occasional PAC noted     Kathee Delton Diplomate, American Board of Sleep Medicine  ELECTRONICALLY SIGNED ON:  12/11/2014, 6:32 PM [place] PH: [phone]   FX: [phone] [place]

## 2014-12-12 NOTE — Telephone Encounter (Signed)
I spoke with patient about results and he verbalized understanding and had no questions. He wants to hold off on therapy for now and wife has not been complaining either. He will call back if he changes his mind.

## 2015-08-20 ENCOUNTER — Other Ambulatory Visit: Payer: Self-pay | Admitting: Gastroenterology

## 2016-09-12 ENCOUNTER — Observation Stay (HOSPITAL_COMMUNITY)
Admission: EM | Admit: 2016-09-12 | Discharge: 2016-09-13 | Disposition: A | Discharge status: Home / Self Care | Payer: Commercial Managed Care - PPO | Attending: Internal Medicine | Admitting: Internal Medicine

## 2016-09-12 ENCOUNTER — Emergency Department (HOSPITAL_COMMUNITY): Payer: Commercial Managed Care - PPO

## 2016-09-12 ENCOUNTER — Encounter (HOSPITAL_COMMUNITY): Payer: Self-pay

## 2016-09-12 DIAGNOSIS — I639 Cerebral infarction, unspecified: Principal | ICD-10-CM | POA: Diagnosis present

## 2016-09-12 DIAGNOSIS — I10 Essential (primary) hypertension: Secondary | ICD-10-CM | POA: Diagnosis present

## 2016-09-12 DIAGNOSIS — Z79899 Other long term (current) drug therapy: Secondary | ICD-10-CM | POA: Insufficient documentation

## 2016-09-12 DIAGNOSIS — H532 Diplopia: Secondary | ICD-10-CM | POA: Diagnosis not present

## 2016-09-12 DIAGNOSIS — R42 Dizziness and giddiness: Secondary | ICD-10-CM | POA: Diagnosis present

## 2016-09-12 DIAGNOSIS — Z7982 Long term (current) use of aspirin: Secondary | ICD-10-CM | POA: Diagnosis not present

## 2016-09-12 DIAGNOSIS — R262 Difficulty in walking, not elsewhere classified: Secondary | ICD-10-CM

## 2016-09-12 DIAGNOSIS — J309 Allergic rhinitis, unspecified: Secondary | ICD-10-CM | POA: Insufficient documentation

## 2016-09-12 DIAGNOSIS — E785 Hyperlipidemia, unspecified: Secondary | ICD-10-CM | POA: Insufficient documentation

## 2016-09-12 DIAGNOSIS — R2681 Unsteadiness on feet: Secondary | ICD-10-CM | POA: Diagnosis not present

## 2016-09-12 HISTORY — DX: Essential (primary) hypertension: I10

## 2016-09-12 LAB — CBC WITH DIFFERENTIAL/PLATELET
Basophils Absolute: 0 10*3/uL (ref 0.0–0.1)
Basophils Relative: 0 %
Eosinophils Absolute: 0 10*3/uL (ref 0.0–0.7)
Eosinophils Relative: 0 %
HEMATOCRIT: 43.2 % (ref 39.0–52.0)
Hemoglobin: 14.6 g/dL (ref 13.0–17.0)
LYMPHS PCT: 14 %
Lymphs Abs: 1.1 10*3/uL (ref 0.7–4.0)
MCH: 30 pg (ref 26.0–34.0)
MCHC: 33.8 g/dL (ref 30.0–36.0)
MCV: 88.9 fL (ref 78.0–100.0)
MONO ABS: 0.3 10*3/uL (ref 0.1–1.0)
MONOS PCT: 4 %
NEUTROS ABS: 6.4 10*3/uL (ref 1.7–7.7)
Neutrophils Relative %: 82 %
Platelets: 219 10*3/uL (ref 150–400)
RBC: 4.86 MIL/uL (ref 4.22–5.81)
RDW: 12.8 % (ref 11.5–15.5)
WBC: 7.8 10*3/uL (ref 4.0–10.5)

## 2016-09-12 LAB — COMPREHENSIVE METABOLIC PANEL
ALT: 14 U/L — ABNORMAL LOW (ref 17–63)
ANION GAP: 7 (ref 5–15)
AST: 21 U/L (ref 15–41)
Albumin: 4.2 g/dL (ref 3.5–5.0)
Alkaline Phosphatase: 54 U/L (ref 38–126)
BILIRUBIN TOTAL: 0.9 mg/dL (ref 0.3–1.2)
BUN: 13 mg/dL (ref 6–20)
CO2: 26 mmol/L (ref 22–32)
Calcium: 9.7 mg/dL (ref 8.9–10.3)
Chloride: 108 mmol/L (ref 101–111)
Creatinine, Ser: 1.06 mg/dL (ref 0.61–1.24)
Glucose, Bld: 108 mg/dL — ABNORMAL HIGH (ref 65–99)
POTASSIUM: 4.5 mmol/L (ref 3.5–5.1)
Sodium: 141 mmol/L (ref 135–145)
TOTAL PROTEIN: 6.8 g/dL (ref 6.5–8.1)

## 2016-09-12 MED ORDER — ADULT MULTIVITAMIN W/MINERALS CH
1.0000 | ORAL_TABLET | Freq: Every day | ORAL | Status: DC
  Administered 2016-09-13: 1 via ORAL
  Filled 2016-09-12: qty 1

## 2016-09-12 MED ORDER — POLYETHYLENE GLYCOL 3350 17 G PO PACK: 17.0000 g | PACK | Freq: Every day | ORAL | Status: DC | PRN

## 2016-09-12 MED ORDER — ACETAMINOPHEN 160 MG/5ML PO SOLN: 650.0000 mg | ORAL | Status: DC | PRN

## 2016-09-12 MED ORDER — TAMSULOSIN HCL 0.4 MG PO CAPS
0.4000 mg | ORAL_CAPSULE | Freq: Every day | ORAL | Status: DC
  Administered 2016-09-13: 0.4 mg via ORAL
  Filled 2016-09-12: qty 1

## 2016-09-12 MED ORDER — IPRATROPIUM BROMIDE 0.06 % NA SOLN
2.0000 | Freq: Two times a day (BID) | NASAL | Status: DC
  Administered 2016-09-13 (×2): 2 via NASAL
  Filled 2016-09-12: qty 15

## 2016-09-12 MED ORDER — ACETAMINOPHEN 650 MG RE SUPP: 650.0000 mg | RECTAL | Status: DC | PRN

## 2016-09-12 MED ORDER — ASPIRIN 300 MG RE SUPP: 300.0000 mg | Freq: Every day | RECTAL | Status: DC

## 2016-09-12 MED ORDER — SODIUM CHLORIDE 0.9 % IV SOLN
INTRAVENOUS | Status: DC
  Administered 2016-09-12: 1000 mL via INTRAVENOUS

## 2016-09-12 MED ORDER — STROKE: EARLY STAGES OF RECOVERY BOOK
Freq: Once | Status: AC
  Administered 2016-09-12: 1

## 2016-09-12 MED ORDER — AMLODIPINE BESYLATE 5 MG PO TABS
5.0000 mg | ORAL_TABLET | Freq: Every day | ORAL | Status: DC
  Administered 2016-09-13: 5 mg via ORAL
  Filled 2016-09-12: qty 1

## 2016-09-12 MED ORDER — FAMOTIDINE 20 MG PO TABS
20.0000 mg | ORAL_TABLET | Freq: Two times a day (BID) | ORAL | Status: DC
  Administered 2016-09-12 – 2016-09-13 (×2): 20 mg via ORAL
  Filled 2016-09-12 (×2): qty 1

## 2016-09-12 MED ORDER — ENOXAPARIN SODIUM 40 MG/0.4ML ~~LOC~~ SOLN
40.0000 mg | SUBCUTANEOUS | Status: DC
  Administered 2016-09-13: 40 mg via SUBCUTANEOUS
  Filled 2016-09-12: qty 0.4

## 2016-09-12 MED ORDER — ASPIRIN 325 MG PO TABS
325.0000 mg | ORAL_TABLET | Freq: Every day | ORAL | Status: DC
  Administered 2016-09-13: 325 mg via ORAL
  Filled 2016-09-12: qty 1

## 2016-09-12 MED ORDER — FLUTICASONE PROPIONATE 50 MCG/ACT NA SUSP
2.0000 | Freq: Every day | NASAL | Status: DC
  Administered 2016-09-12: 2 via NASAL
  Filled 2016-09-12: qty 16

## 2016-09-12 MED ORDER — IRBESARTAN 150 MG PO TABS
150.0000 mg | ORAL_TABLET | Freq: Every day | ORAL | Status: DC
  Administered 2016-09-13: 150 mg via ORAL
  Filled 2016-09-12: qty 1

## 2016-09-12 MED ORDER — ACETAMINOPHEN 325 MG PO TABS: 650.0000 mg | ORAL_TABLET | ORAL | Status: DC | PRN

## 2016-09-12 MED ORDER — SENNOSIDES-DOCUSATE SODIUM 8.6-50 MG PO TABS: 1.0000 | ORAL_TABLET | Freq: Every evening | ORAL | Status: DC | PRN

## 2016-09-12 MED ORDER — DARIFENACIN HYDROBROMIDE ER 7.5 MG PO TB24
7.5000 mg | ORAL_TABLET | Freq: Every day | ORAL | Status: DC
Start: 2016-09-13 — End: 2016-09-13
  Administered 2016-09-13: 7.5 mg via ORAL
  Filled 2016-09-12: qty 1

## 2016-09-12 NOTE — ED Provider Notes (Signed)
[facility] DEPT Provider Note   CSN: [id number] Arrival date & time: 09/12/16  1537     History   Chief Complaint Chief Complaint  Patient presents with  . Dizziness  . Diplopia    HPI Omar Reed is a 66 y.o. male.  Level 5 caveat for urgent need for intervention. Patient presents with "zinging" sensation in his bilateral temporoparietal areas in the middle of night.  When he got up to use the restroom at approx 6p, he noticed visual disturbances in both eyes, right greater than left, which included blurred vision and diplopia. Symptoms have persisted. No motor or sensory deficits. No confusion. No previous history of stroke. Past medical history includes hypertension and hyperlipidemia. Severity of symptoms is moderate to severe.      Past Medical History:  Diagnosis Date  . Anxiety     Patient Active Problem List   Diagnosis Date Noted  . CVA (cerebral vascular accident) ([place]) 09/12/2016  . Over weight 06/25/2013  . Hyperlipidemia 06/25/2013  . Pain in shin 01/04/2013  . Plantar fasciitis of left foot 11/23/2012  . Hamstring strain 11/23/2012  . PERSISTENT DISORDER INITIATING/MAINTAINING SLEEP 12/01/2007  . Obstructive sleep apnea 12/01/2007  . ALLERGIC RHINITIS 12/01/2007  . HEADACHE, CHRONIC 12/01/2007  . FOOT PAIN 10/24/2007  . HALLUX RIGIDUS, ACQUIRED 10/24/2007    Past Surgical History:  Procedure Laterality Date  . APPENDECTOMY    . NASAL SINUS SURGERY         Home Medications    Prior to Admission medications   Medication Sig Start Date End Date Taking? Authorizing Provider  amLODipine (NORVASC) 5 MG tablet Take 5 mg by mouth daily. 10/24/12   Historical Provider, MD  aspirin 81 MG tablet Take 81 mg by mouth daily as needed.     Historical Provider, MD    Family History History reviewed. No pertinent family history.  Social History Social History  Substance Use Topics  . Smoking status: Never Smoker  . Smokeless tobacco: Never Used    . Alcohol use No     Allergies   Patient has no known allergies.   Review of Systems Review of Systems  All other systems reviewed and are negative.    Physical Exam Updated Vital Signs BP 172/86   Pulse 74   Temp 98.3 F (36.8 C) (Oral)   Resp 17   SpO2 100%   Physical Exam  Constitutional: He is oriented to person, place, and time. He appears well-developed and well-nourished.  HENT:  Head: Normocephalic and atraumatic.  Eyes: Conjunctivae and EOM are normal. Pupils are equal, round, and reactive to light.  Patient unwilling to open both eyes simultaneously secondary to diplopia. Fundus examined and appears normal bilaterally.  Neck: Neck supple.  Cardiovascular: Normal rate and regular rhythm.   Pulmonary/Chest: Effort normal and breath sounds normal.  Abdominal: Soft. Bowel sounds are normal.  Musculoskeletal: Normal range of motion.  Neurological: He is alert and oriented to person, place, and time.  Skin: Skin is warm and dry.  Psychiatric: He has a normal mood and affect. His behavior is normal.  Nursing note and vitals reviewed.    ED Treatments / Results  Labs (all labs ordered are listed, but only abnormal results are displayed) Labs Reviewed  COMPREHENSIVE METABOLIC PANEL - Abnormal; Notable for the following:       Result Value   Glucose, Bld 108 (*)    ALT 14 (*)    All other components within normal limits  CBC WITH DIFFERENTIAL/PLATELET    EKG  EKG Interpretation  Date/Time:  Sunday September 12 2016 15:42:22 EST Ventricular Rate:  70 PR Interval:  130 QRS Duration: 84 QT Interval:  380 QTC Calculation: 410 R Axis:   82 Text Interpretation:  Normal sinus rhythm with sinus arrhythmia Nonspecific ST abnormality Abnormal ECG Confirmed by Lacinda Axon  MD, Rajesh Wyss ([id number]) on 09/12/2016 4:56:17 PM       Radiology Ct Head Wo Contrast  Result Date: 09/12/2016 CLINICAL DATA:  Ct head wo, pt has been feeling zapping sensation in head, blurred vision ,  x1 day EXAM: CT HEAD WITHOUT CONTRAST TECHNIQUE: Contiguous axial images were obtained from the base of the skull through the vertex without intravenous contrast. COMPARISON:  None. FINDINGS: Brain: No evidence of acute infarction, hemorrhage, hydrocephalus, extra-axial collection or mass lesion/mass effect. The ventricles are normal in configuration. There is ventricular sulcal enlargement consistent with mild atrophy. Vascular: No hyperdense vessel or unexpected calcification. Skull: Normal. Negative for fracture or focal lesion. Sinuses/Orbits: There changes from previous sinus surgery. There is mucosal thickening along the floor of the right maxillary sinus and involving several of the remaining ethmoid air cells and the inferior right frontal sinus. Mastoid air cells are clear. Globes and orbits are unremarkable. Other: None. IMPRESSION: 1. No acute intracranial abnormalities. 2. Mild atrophy. Electronically Signed   By: Lajean Manes M.D.   On: 09/12/2016 17:45   Mr Brain Wo Contrast  Result Date: 09/12/2016 CLINICAL DATA:  Diplopia. EXAM: MRI HEAD WITHOUT CONTRAST TECHNIQUE: Multiplanar, multiecho pulse sequences of the brain and surrounding structures were obtained without intravenous contrast. COMPARISON:  CT head 09/12/2016 FINDINGS: Brain: 3 mm acute infarct in the right midbrain, immediately anterior and to the right of the cerebral aqueduct. This involves the oculomotor nucleus accounting for diplopia. No other acute infarct. No significant chronic ischemia. Negative for hemorrhage or mass. Incidental small arachnoid cyst in the right cerebellar pontine angle cistern. Pituitary normal in size. Vascular: Normal arterial flow voids. Skull and upper cervical spine: Negative Sinuses/Orbits: Mucosal edema paranasal sinuses.  Normal orbit. Other: None IMPRESSION: 3 mm acute infarct involving the oculomotor nucleus on the right. No other significant ischemia. Electronically Signed   By: Franchot Gallo M.D.    On: 09/12/2016 18:32    Procedures Procedures (including critical care time)  Medications Ordered in ED Medications - No data to display   Initial Impression / Assessment and Plan / ED Course  I have reviewed the triage vital signs and the nursing notes.  Pertinent labs & imaging results that were available during my care of the patient were reviewed by me and considered in my medical decision making (see chart for details).    CRITICAL CARE Performed by: Nat Christen  ?  Total critical care time: 30 minutes  Critical care time was exclusive of separately billable procedures and treating other patients.  Critical care was necessary to treat or prevent imminent or life-threatening deterioration.  Critical care was time spent personally by me on the following activities: development of treatment plan with patient and/or surrogate as well as nursing, discussions with consultants, evaluation of patient's response to treatment, examination of patient, obtaining history from patient or surrogate, ordering and performing treatments and interventions, ordering and review of laboratory studies, ordering and review of radiographic studies, pulse oximetry and re-evaluation of patient's condition. Clinical Course     MRI of brain reveals an acute 3 mm oculomotor nucleus infarct on the right.  He is not confused.  His motor and sensory exams are normal. Discussed with neurology. Admit to general medicine.  Final Clinical Impressions(s) / ED Diagnoses   Final diagnoses:  Cerebrovascular accident (CVA), unspecified mechanism ([place])    New Prescriptions New Prescriptions   No medications on file     Nat Christen, MD 09/12/16 2035

## 2016-09-12 NOTE — H&P (Signed)
History and Physical    Omar Reed [id number] DOB: Sep 12, 1950 DOA: 09/12/2016  PCP: Tivis Ringer, MD  Patient coming from: Home.  Chief Complaint: Diplopia. Difficulty walking.  HPI: Omar Reed is a 66 y.o. male with history of hypertension and sleep apnea presents to the ER with complaints of persistent diplopia and gait difficulties. Patient's symptoms started around 6 AM. Prior to that patient has been having some sensation of electrical activity going through his forehead. At 6:00 AM patient noticed that he has been having double vision and on trying to walk he was having unsteady gait. Patient presented to the ER later and had MRI of the brain which showed acute stroke involving the ocular motor nucleus. Neurologist on call was consulted. Patient was felt not be a candidate for TPA. Patient is being admitted for further stroke workup. Patient denies any difficulty speaking swallowing or any weakness in the upper or lower extremities. Patient still has diplopia. On exam patient is also not able to completely adduct his right eye.   ED Course: MRI brain shows acute stroke. EKG shows normal sinus rhythm with nonspecific ST-T changes.  Review of Systems: As per HPI, rest all negative.   Past Medical History:  Diagnosis Date  . Anxiety   . Hypertension     Past Surgical History:  Procedure Laterality Date  . APPENDECTOMY    . NASAL SINUS SURGERY       reports that he has never smoked. He has never used smokeless tobacco. He reports that he does not drink alcohol or use drugs.  Allergies  Allergen Reactions  . Griseofulvin Other (See Comments)    Severe headaches    Family History  Problem Relation Age of Onset  . CAD Mother     Prior to Admission medications   Medication Sig Start Date End Date Taking? Authorizing Provider  amLODipine (NORVASC) 5 MG tablet Take 5 mg by mouth daily after lunch.  10/24/12  Yes Historical Provider, MD  aspirin EC 81 MG tablet  Take 81 mg by mouth at bedtime.   Yes Historical Provider, MD  fluticasone (FLONASE) 50 MCG/ACT nasal spray Place 2 sprays into both nostrils at bedtime. 06/14/16  Yes Historical Provider, MD  ipratropium (ATROVENT) 0.03 % nasal spray Place 2 sprays into both nostrils See admin instructions. Instill 2 sprays into each nare twice daily - morning and mid-afternoon   Yes Historical Provider, MD  irbesartan (AVAPRO) 150 MG tablet Take 150 mg by mouth daily after lunch. 07/13/16  Yes Historical Provider, MD  Mouthwashes (BIOTENE DRY MOUTH MT) Use as directed 15 mLs in the mouth or throat at bedtime. Swish and spit   Yes Historical Provider, MD  Mouthwashes (MOUTHWASH MT) Use as directed in the mouth or throat See admin instructions. Swish and spit Crest mouthwash when awakened during the night - 2-3 times nightly - for dry mouth   Yes Historical Provider, MD  Multiple Vitamin (MULTIVITAMIN WITH MINERALS) TABS tablet Take 1 tablet by mouth daily after lunch. Centrum Silver   Yes Historical Provider, MD  polyethylene glycol (MIRALAX / GLYCOLAX) packet Take 17 g by mouth daily as needed (constipation). Mix in 4-8 oz water and drink   Yes Historical Provider, MD  ranitidine (ZANTAC) 150 MG tablet Take 150 mg by mouth daily before breakfast.   Yes Historical Provider, MD  solifenacin (VESICARE) 10 MG tablet Take 10 mg by mouth daily after lunch.   Yes Historical Provider, MD  tamsulosin [facility])  0.4 MG CAPS capsule Take 0.4 mg by mouth daily after lunch. 08/03/16  Yes Historical Provider, MD    Physical Exam: Vitals:   09/12/16 2045 09/12/16 2100 09/12/16 2115 09/12/16 2124  BP: 141/90 137/89 134/92 (!) 164/87  Pulse: 77 76 71 78  Resp: 12 11 10 15   Temp:    98.3 F (36.8 C)  TempSrc:    Oral  SpO2: 97% 96% 96% 99%      Constitutional: Moderately built and nourished. Vitals:   09/12/16 2045 09/12/16 2100 09/12/16 2115 09/12/16 2124  BP: 141/90 137/89 134/92 (!) 164/87  Pulse: 77 76 71 78  Resp:  12 11 10 15   Temp:    98.3 F (36.8 C)  TempSrc:    Oral  SpO2: 97% 96% 96% 99%   Eyes: Anicteric no pallor. Unable to completely adduct his right eye. Pupils reacting to light. ENMT: No discharge from the ears eyes nose and mouth. Neck: No neck rigidity no mass felt. Respiratory: No rhonchi or crepitations. Cardiovascular: S1-S2 heard no murmurs appreciated. Abdomen: Soft nontender bowel sounds present. No guarding or rigidity. Musculoskeletal: No edema. No joint effusion. Skin: No rash. Skin appears warm. Neurologic: Alert awake oriented to time place and person. Moves all extremities 5 x 5. No facial asymmetry. Unable to adduct his right eye completely. Pupils reacting to light equally. Psychiatric: Appears normal. Normal affect.   Labs on Admission: I have personally reviewed following labs and imaging studies  CBC:  Recent Labs Lab 09/12/16 1728  WBC 7.8  NEUTROABS 6.4  HGB 14.6  HCT 43.2  MCV 88.9  PLT [id number]   Basic Metabolic Panel:  Recent Labs Lab 09/12/16 1728  NA 141  K 4.5  CL 108  CO2 26  GLUCOSE 108*  BUN 13  CREATININE 1.06  CALCIUM 9.7   GFR: CrCl cannot be calculated (Unknown ideal weight.). Liver Function Tests:  Recent Labs Lab 09/12/16 1728  AST 21  ALT 14*  ALKPHOS 54  BILITOT 0.9  PROT 6.8  ALBUMIN 4.2   No results for input(s): LIPASE, AMYLASE in the last 168 hours. No results for input(s): AMMONIA in the last 168 hours. Coagulation Profile: No results for input(s): INR, PROTIME in the last 168 hours. Cardiac Enzymes: No results for input(s): CKTOTAL, CKMB, CKMBINDEX, TROPONINI in the last 168 hours. BNP (last 3 results) No results for input(s): PROBNP in the last 8760 hours. HbA1C: No results for input(s): HGBA1C in the last 72 hours. CBG: No results for input(s): GLUCAP in the last 168 hours. Lipid Profile: No results for input(s): CHOL, HDL, LDLCALC, TRIG, CHOLHDL, LDLDIRECT in the last 72 hours. Thyroid Function  Tests: No results for input(s): TSH, T4TOTAL, FREET4, T3FREE, THYROIDAB in the last 72 hours. Anemia Panel: No results for input(s): VITAMINB12, FOLATE, FERRITIN, TIBC, IRON, RETICCTPCT in the last 72 hours. Urine analysis: No results found for: COLORURINE, APPEARANCEUR, LABSPEC, PHURINE, GLUCOSEU, HGBUR, BILIRUBINUR, KETONESUR, PROTEINUR, UROBILINOGEN, NITRITE, LEUKOCYTESUR Sepsis Labs: @LABRCNTIP (procalcitonin:4,lacticidven:4) )No results found for this or any previous visit (from the past 240 hour(s)).   Radiological Exams on Admission: Ct Head Wo Contrast  Result Date: 09/12/2016 CLINICAL DATA:  Ct head wo, pt has been feeling zapping sensation in head, blurred vision , x1 day EXAM: CT HEAD WITHOUT CONTRAST TECHNIQUE: Contiguous axial images were obtained from the base of the skull through the vertex without intravenous contrast. COMPARISON:  None. FINDINGS: Brain: No evidence of acute infarction, hemorrhage, hydrocephalus, extra-axial collection or mass lesion/mass effect. The ventricles  are normal in configuration. There is ventricular sulcal enlargement consistent with mild atrophy. Vascular: No hyperdense vessel or unexpected calcification. Skull: Normal. Negative for fracture or focal lesion. Sinuses/Orbits: There changes from previous sinus surgery. There is mucosal thickening along the floor of the right maxillary sinus and involving several of the remaining ethmoid air cells and the inferior right frontal sinus. Mastoid air cells are clear. Globes and orbits are unremarkable. Other: None. IMPRESSION: 1. No acute intracranial abnormalities. 2. Mild atrophy. Electronically Signed   By: Lajean Manes M.D.   On: 09/12/2016 17:45   Mr Brain Wo Contrast  Result Date: 09/12/2016 CLINICAL DATA:  Diplopia. EXAM: MRI HEAD WITHOUT CONTRAST TECHNIQUE: Multiplanar, multiecho pulse sequences of the brain and surrounding structures were obtained without intravenous contrast. COMPARISON:  CT head  09/12/2016 FINDINGS: Brain: 3 mm acute infarct in the right midbrain, immediately anterior and to the right of the cerebral aqueduct. This involves the oculomotor nucleus accounting for diplopia. No other acute infarct. No significant chronic ischemia. Negative for hemorrhage or mass. Incidental small arachnoid cyst in the right cerebellar pontine angle cistern. Pituitary normal in size. Vascular: Normal arterial flow voids. Skull and upper cervical spine: Negative Sinuses/Orbits: Mucosal edema paranasal sinuses.  Normal orbit. Other: None IMPRESSION: 3 mm acute infarct involving the oculomotor nucleus on the right. No other significant ischemia. Electronically Signed   By: Franchot Gallo M.D.   On: 09/12/2016 18:32    EKG: Independently reviewed. Normal sinus rhythm with nonspecific ST changes.  Assessment/Plan Principal Problem:   Acute ischemic stroke [facility]) Active Problems:   Essential hypertension   Stroke (cerebrum) ([place])    1. Acute ischemic stroke - neurologist on-call has been consulted. Patient was felt not to be a candidate for TPA. Patient has been placed on neurochecks. Get MRA brain 2-D echo and carotid Doppler. Monitor in telemetry for arrhythmias. Check lipid panel and hemoglobin A1c. Patient has passed swallow. Patient is on aspirin. Get physical therapy consult. 2. Hypertension - allow for permissive hypertension due to stroke. Continue amlodipine and ARB. 3. History of sleep apnea - has not been recently using CPAP.   DVT prophylaxis: Lovenox. Code Status: Full code.  Family Communication: Patient's wife.  Disposition Plan: Home.  Consults called: Neurology.  Admission status: Observation.    Rise Patience MD [facility] Pager [phone].  If 7PM-7AM, please contact night-coverage [email or web address] Password [facility]  09/12/2016, 10:57 PM

## 2016-09-12 NOTE — ED Triage Notes (Signed)
Pt complaining of flashing in head. Pt complaining new onset double vision and decreased sense of balance. Pt states onset ~6am. Pt with no focal neuro deficits at triage.

## 2016-09-12 NOTE — Consult Note (Signed)
NEURO HOSPITALIST CONSULT NOTE   Requestig physician: Dr. Lacinda Axon  Reason for Consult: New onset double vision  History obtained from:  Patient   HPI:                                                                                                                                          Omar Reed is an 65 y.o. male who presented with double vision and gait imbalance. Symptoms were preceded by sensation of "zinging" electrical shock-like sensations bitemporally, which woke him up from sleep. Also described as a "flashing in head" sensation. When he got up, he noticed binocular double vision (relieved by closing either eye) and wobbly gait with decreased sense of balance. Onset of symptoms was about 6 AM on Sunday. He endorsed frontal headache but denied neck pain, chest pain, abdominal pain or limb pain. He has no prior history of stroke or MI. He exercises regularly, tries to eat healthy and does not smoke.   MRI revealed a 3 mm acute infarct involving the oculomotor nucleus on the right. No other significant ischemia was seen.  Past Medical History:  Diagnosis Date  . Anxiety     Past Surgical History:  Procedure Laterality Date  . APPENDECTOMY    . NASAL SINUS SURGERY      History reviewed. No pertinent family history.  Social History:  reports that he has never smoked. He has never used smokeless tobacco. He reports that he does not drink alcohol or use drugs.  No Known Allergies  MEDICATIONS:                                                                                                                     Prior to Admission:  Prescriptions Prior to Admission  Medication Sig Dispense Refill Last Dose  . amLODipine (NORVASC) 5 MG tablet Take 5 mg by mouth daily after lunch.    09/11/2016 at Unknown time  . aspirin EC 81 MG tablet Take 81 mg by mouth at bedtime.   09/11/2016 at Unknown time  . fluticasone (FLONASE) 50 MCG/ACT nasal spray Place 2 sprays into  both nostrils at bedtime.   09/11/2016 at Unknown time  . ipratropium (ATROVENT) 0.03 % nasal spray Place 2 sprays into both nostrils See admin  instructions. Instill 2 sprays into each nare twice daily - morning and mid-afternoon   09/11/2016 at Unknown time  . irbesartan (AVAPRO) 150 MG tablet Take 150 mg by mouth daily after lunch.   09/11/2016 at Unknown time  . Mouthwashes (BIOTENE DRY MOUTH MT) Use as directed 15 mLs in the mouth or throat at bedtime. Swish and spit   09/11/2016 at Unknown time  . Mouthwashes (MOUTHWASH MT) Use as directed in the mouth or throat See admin instructions. Swish and spit Crest mouthwash when awakened during the night - 2-3 times nightly - for dry mouth   09/11/2016 at Unknown time  . Multiple Vitamin (MULTIVITAMIN WITH MINERALS) TABS tablet Take 1 tablet by mouth daily after lunch. Centrum Silver   09/11/2016 at Unknown time  . polyethylene glycol (MIRALAX / GLYCOLAX) packet Take 17 g by mouth daily as needed (constipation). Mix in 4-8 oz water and drink   few days ago  . ranitidine (ZANTAC) 150 MG tablet Take 150 mg by mouth daily before breakfast.   09/11/2016 at Unknown time  . solifenacin (VESICARE) 10 MG tablet Take 10 mg by mouth daily after lunch.   09/11/2016 at Unknown time  . tamsulosin (FLOMAX) 0.4 MG CAPS capsule Take 0.4 mg by mouth daily after lunch.   09/11/2016 at Unknown time    ROS:                                                                                                                                       History obtained from patient. As per HPI.  Blood pressure 143/94, pulse 71, temperature 98.3 F (36.8 C), temperature source Oral, resp. rate 11, SpO2 100 %.  General Examination:                                                                                                      HEENT-  Normocephalic/atraumatic.  Lungs- Respirations unlabored. No gross wheezing.  Extremities- Warm and well perfused.   Neurological Examination Mental Status:  Alert, oriented, thought content appropriate.  Speech fluent without evidence of aphasia.  Able to follow all commands without difficulty. Cranial Nerves: II: Visual fields intact to confrontation, PERRL, fixates normally with each eye individually III,IV, VI: Favors left eye at baseline, squinting right eye shut. EOMI left eye.  Right eye: Impaired gaze towards the left, unable to cross midline. Intact with rightward gaze, upgaze and downgaze. No nystagmus appreciated.  V,VII: smile symmetric, facial temperature sensation normal bilaterally VIII: hearing  intact to conversation IX,X: no hoarseness or hypophonia XI: symmetric XII: midline tongue extension Motor: Right : Upper extremity   5/5    Left:     Upper extremity   5/5  Lower extremity   5/5     Lower extremity   5/5 Normal tone throughout; no atrophy noted Sensory: Temperature and light touch intact in all 4 extremities Deep Tendon Reflexes: 2+ and symmetric in upper and lower extremities. Toes downgoing.  Cerebellar: No ataxia with FNF bilaterally. Fine action tremor noted bilaterally. Gait: Deferred  Lab Results: Basic Metabolic Panel:  Recent Labs Lab 09/12/16 1728  NA 141  K 4.5  CL 108  CO2 26  GLUCOSE 108*  BUN 13  CREATININE 1.06  CALCIUM 9.7    Liver Function Tests:  Recent Labs Lab 09/12/16 1728  AST 21  ALT 14*  ALKPHOS 54  BILITOT 0.9  PROT 6.8  ALBUMIN 4.2   No results for input(s): LIPASE, AMYLASE in the last 168 hours. No results for input(s): AMMONIA in the last 168 hours.  CBC:  Recent Labs Lab 09/12/16 1728  WBC 7.8  NEUTROABS 6.4  HGB 14.6  HCT 43.2  MCV 88.9  PLT 219    Cardiac Enzymes: No results for input(s): CKTOTAL, CKMB, CKMBINDEX, TROPONINI in the last 168 hours.  Lipid Panel: No results for input(s): CHOL, TRIG, HDL, CHOLHDL, VLDL, LDLCALC in the last 168 hours.  CBG: No results for input(s): GLUCAP in the last 168 hours.  Microbiology: No results found for this  or any previous visit.  Coagulation Studies: No results for input(s): LABPROT, INR in the last 72 hours.  Imaging: Ct Head Wo Contrast  Result Date: 09/12/2016 CLINICAL DATA:  Ct head wo, pt has been feeling zapping sensation in head, blurred vision , x1 day EXAM: CT HEAD WITHOUT CONTRAST TECHNIQUE: Contiguous axial images were obtained from the base of the skull through the vertex without intravenous contrast. COMPARISON:  None. FINDINGS: Brain: No evidence of acute infarction, hemorrhage, hydrocephalus, extra-axial collection or mass lesion/mass effect. The ventricles are normal in configuration. There is ventricular sulcal enlargement consistent with mild atrophy. Vascular: No hyperdense vessel or unexpected calcification. Skull: Normal. Negative for fracture or focal lesion. Sinuses/Orbits: There changes from previous sinus surgery. There is mucosal thickening along the floor of the right maxillary sinus and involving several of the remaining ethmoid air cells and the inferior right frontal sinus. Mastoid air cells are clear. Globes and orbits are unremarkable. Other: None. IMPRESSION: 1. No acute intracranial abnormalities. 2. Mild atrophy. Electronically Signed   By: Lajean Manes M.D.   On: 09/12/2016 17:45   Mr Brain Wo Contrast  Result Date: 09/12/2016 CLINICAL DATA:  Diplopia. EXAM: MRI HEAD WITHOUT CONTRAST TECHNIQUE: Multiplanar, multiecho pulse sequences of the brain and surrounding structures were obtained without intravenous contrast. COMPARISON:  CT head 09/12/2016 FINDINGS: Brain: 3 mm acute infarct in the right midbrain, immediately anterior and to the right of the cerebral aqueduct. This involves the oculomotor nucleus accounting for diplopia. No other acute infarct. No significant chronic ischemia. Negative for hemorrhage or mass. Incidental small arachnoid cyst in the right cerebellar pontine angle cistern. Pituitary normal in size. Vascular: Normal arterial flow voids. Skull and upper  cervical spine: Negative Sinuses/Orbits: Mucosal edema paranasal sinuses.  Normal orbit. Other: None IMPRESSION: 3 mm acute infarct involving the oculomotor nucleus on the right. No other significant ischemia. Electronically Signed   By: Franchot Gallo M.D.   On: 09/12/2016 18:32  Assessment: 1. Acute subcentimeter lacunar infarction involving the right oculomotor nucleus with associated clinical deficit of right medial rectus palsy. Upgaze and downgaze are spared.  2. Classifiable as having failed ASA monotherapy.  3. HTN.   Recommendations: 1. MRA of head, MRA of neck, TTE.   2. Add Aggrenox to ASA. 3. Atorvastatin 40 mg po qd. Obtain baseline CK level.  4. Permissive HTN x 24 hours.  5. PT/OT/Speech.  6. Will need outpatient OT to specifically address his oculomotor deficit. 7. Will need to keep a BP diary to assist in optimization of his outpatient antihypertensive regimen.  8. Continue with light aerobic exercise regimen as an outpatient, which will be of benefit for improving his BP.   Electronically signed: Dr. Kerney Elbe 09/12/2016, 7:16 PM

## 2016-09-12 NOTE — ED Notes (Signed)
Taken to MRI 

## 2016-09-12 NOTE — Progress Notes (Signed)
Patient arrived around 2200 alert and oriented was able to ambulate to bed and rest room, has doubal vision, some dizziness. Passed swallow screen awiting orders for patient. Will continue to monitor.

## 2016-09-12 NOTE — ED Notes (Addendum)
Neurologist at bedside. 

## 2016-09-12 NOTE — ED Notes (Addendum)
PT was eating when walking into room  Pt was finished eat when ask what was he eating. PT stated that it was his breakfast it was a protein bar.

## 2016-09-12 NOTE — ED Notes (Signed)
Complaining of double vision, eyes WDL on examination. Only likes them open one at a time

## 2016-09-13 ENCOUNTER — Encounter (HOSPITAL_COMMUNITY): Payer: Self-pay | Admitting: General Practice

## 2016-09-13 ENCOUNTER — Other Ambulatory Visit (HOSPITAL_COMMUNITY): Payer: Commercial Managed Care - PPO

## 2016-09-13 ENCOUNTER — Observation Stay (HOSPITAL_COMMUNITY): Payer: Commercial Managed Care - PPO

## 2016-09-13 ENCOUNTER — Observation Stay (HOSPITAL_BASED_OUTPATIENT_CLINIC_OR_DEPARTMENT_OTHER): Payer: Commercial Managed Care - PPO

## 2016-09-13 DIAGNOSIS — E785 Hyperlipidemia, unspecified: Secondary | ICD-10-CM | POA: Diagnosis not present

## 2016-09-13 DIAGNOSIS — I639 Cerebral infarction, unspecified: Secondary | ICD-10-CM | POA: Diagnosis not present

## 2016-09-13 DIAGNOSIS — I10 Essential (primary) hypertension: Secondary | ICD-10-CM | POA: Diagnosis not present

## 2016-09-13 DIAGNOSIS — I6789 Other cerebrovascular disease: Secondary | ICD-10-CM

## 2016-09-13 LAB — COMPREHENSIVE METABOLIC PANEL
ALBUMIN: 3.6 g/dL (ref 3.5–5.0)
ALT: 13 U/L — AB (ref 17–63)
AST: 17 U/L (ref 15–41)
Alkaline Phosphatase: 51 U/L (ref 38–126)
Anion gap: 6 (ref 5–15)
BUN: 17 mg/dL (ref 6–20)
CALCIUM: 9.3 mg/dL (ref 8.9–10.3)
CHLORIDE: 107 mmol/L (ref 101–111)
CO2: 29 mmol/L (ref 22–32)
CREATININE: 1.25 mg/dL — AB (ref 0.61–1.24)
GFR calc Af Amer: >60 mL/min (ref >60)
GFR calc non Af Amer: 59 mL/min — ABNORMAL LOW (ref >60)
GLUCOSE: 102 mg/dL — AB (ref 65–99)
Potassium: 3.9 mmol/L (ref 3.5–5.1)
SODIUM: 142 mmol/L (ref 135–145)
Total Bilirubin: 0.6 mg/dL (ref 0.3–1.2)
Total Protein: 5.9 g/dL — ABNORMAL LOW (ref 6.5–8.1)

## 2016-09-13 LAB — LIPID PANEL
CHOL/HDL RATIO: 3.2 ratio
Cholesterol: 175 mg/dL (ref 0–200)
HDL: 55 mg/dL (ref >40)
LDL Cholesterol: 107 mg/dL — ABNORMAL HIGH (ref 0–99)
TRIGLYCERIDES: 65 mg/dL (ref <150)
VLDL: 13 mg/dL (ref 0–40)

## 2016-09-13 LAB — CBC
HCT: 40.4 % (ref 39.0–52.0)
Hemoglobin: 13.4 g/dL (ref 13.0–17.0)
MCH: 29.8 pg (ref 26.0–34.0)
MCHC: 33.2 g/dL (ref 30.0–36.0)
MCV: 89.8 fL (ref 78.0–100.0)
PLATELETS: 204 10*3/uL (ref 150–400)
RBC: 4.5 MIL/uL (ref 4.22–5.81)
RDW: 13 % (ref 11.5–15.5)
WBC: 5 10*3/uL (ref 4.0–10.5)

## 2016-09-13 LAB — VAS US CAROTID
LCCADSYS: -68 cm/s
LEFT ECA DIAS: -11 cm/s
LEFT VERTEBRAL DIAS: -10 cm/s
Left CCA dist dias: -19 cm/s
Left CCA prox dias: 22 cm/s
Left CCA prox sys: 96 cm/s
Left ICA dist dias: -27 cm/s
Left ICA dist sys: -76 cm/s
Left ICA prox dias: -20 cm/s
Left ICA prox sys: -59 cm/s
RCCADSYS: -48 cm/s
RCCAPDIAS: -13 cm/s
RCCAPSYS: -53 cm/s
RIGHT ECA DIAS: -14 cm/s
RIGHT VERTEBRAL DIAS: -12 cm/s

## 2016-09-13 LAB — ECHOCARDIOGRAM COMPLETE: Height: 64 in

## 2016-09-13 LAB — TROPONIN I

## 2016-09-13 MED ORDER — ATORVASTATIN CALCIUM 20 MG PO TABS: 20.0000 mg | ORAL_TABLET | Freq: Every day | ORAL | 0 refills | Status: DC

## 2016-09-13 MED ORDER — CLOPIDOGREL BISULFATE 75 MG PO TABS: 75.0000 mg | ORAL_TABLET | Freq: Every day | ORAL | 0 refills | Status: DC

## 2016-09-13 MED ORDER — CLOPIDOGREL BISULFATE 75 MG PO TABS
75.0000 mg | ORAL_TABLET | Freq: Every day | ORAL | Status: DC
  Administered 2016-09-13: 75 mg via ORAL
  Filled 2016-09-13: qty 1

## 2016-09-13 MED ORDER — ATORVASTATIN CALCIUM 10 MG PO TABS
20.0000 mg | ORAL_TABLET | Freq: Every day | ORAL | Status: DC
  Administered 2016-09-13: 20 mg via ORAL
  Filled 2016-09-13: qty 2

## 2016-09-13 NOTE — Progress Notes (Signed)
Patient is discharged from room 5M07 at this time. Alert and in stable condition. IV site d/c'd as well as tele. Instructions read to patient and wife with understanding verbalized. Left unit via wheelchair with all belongings at side.

## 2016-09-13 NOTE — Progress Notes (Signed)
**  Preliminary report by tech**  Carotid artery duplex complete. Findings are consistent with a 1-39 percent stenosis involving the right internal carotid artery and the left internal carotid artery. The vertebral arteries demonstrate antegrade flow.  09/13/16 3:27 PM Omar Reed RVT

## 2016-09-13 NOTE — Evaluation (Addendum)
Physical Therapy Evaluation Patient Details Name: Omar Reed MRN: [id number] DOB: 04-Jun-1951 Today's Date: 09/13/2016   History of Present Illness  Pt admitted with diplopia, dizziness and difficulty walking. + R oculomotor nucleus infarct. PMH: HTN, sleep apnea.   Clinical Impression  Pt very pleasant and active who enjoys finding waterfalls. Pt with continued diplopia with improved function with use of one eye closed. Glasses currently taped by OT but pt states he does not find it as helpful acutely as single eye vision. Pt able to perform standing balance with Romberg stance 30 sec with single eye open and both eyes closed, 2 sec single limb stance with monocular vision. Educated for safety to sit for bathing, dressing, supervision for stairs and bar to grasp with enter/exiting shower. Pt with decreased balance, gait and functional mobility who will benefit from acute therapy to maximize independence and gait to decrease burden of care. Pt and wife educated for CVA risk factors as well as BE FAST.    Follow Up Recommendations Home health PT    Equipment Recommendations  None recommended by PT    Recommendations for Other Services       Precautions / Restrictions Precautions Precautions: Fall Precaution Comments: diplopia      Mobility  Bed Mobility Overal bed mobility: Modified Independent Bed Mobility: Supine to Sit         General bed mobility comments: increased time  Transfers Overall transfer level: Needs assistance Transfers: Sit to/from Stand Sit to Stand: Min guard        General transfer comment: guarding for safety x 3 trials without assist to rise or balance  Ambulation/Gait Ambulation/Gait assistance: Min guard Ambulation Distance (Feet): 400 Feet Assistive device: Rolling walker (2 wheeled);None Gait Pattern/deviations: Step-through pattern;Decreased stride length   Gait velocity interpretation: Below normal speed for age/gender General Gait  Details: pt able to complete long hall ambulation with one eye closed with decreased speed and no overt LOB, slight unsteadiness with turning but no need for physical assist to correct. Slight improvement with RW with increased pt comfort for use with both eyes open due to diplopia. Cues for scanning environment with eye closed, deficit with depth perception and ability to perform single limb stance  Stairs Stairs: Yes Stairs assistance: Min guard Stair Management: Alternating pattern;Forwards;One rail Left Number of Stairs: 15 General stair comments: pt was able to complete 3 stairs safely but slowly without rail or HHA. With use of of rail and decreased speed completed flight with cues for safety as initial step to descend with slight unsteadiness  Wheelchair Mobility    Modified Rankin (Stroke Patients Only) Modified Rankin (Stroke Patients Only) Pre-Morbid Rankin Score: No symptoms Modified Rankin: Moderate disability     Balance Overall balance assessment: Needs assistance Sitting-balance support: Feet supported Sitting balance-Leahy Scale: Good       Standing balance-Leahy Scale: Good               High level balance activites: Direction changes               Pertinent Vitals/Pain Pain Assessment: No/denies pain    Home Living Family/patient expects to be discharged to:: Private residence Living Arrangements: Spouse/significant other Available Help at Discharge: Family;Available PRN/intermittently Type of Home: House Home Access: Stairs to enter Entrance Stairs-Rails: None Entrance Stairs-Number of Steps: 4 Home Layout: Two level;Able to live on main level with bedroom/bathroom Home Equipment: Bedside commode;Shower seat - built in      Prior Function Level  of Independence: Independent         Comments: active, retired     Journalist, newspaper   Dominant Hand: Right    Extremity/Trunk Assessment   Upper Extremity Assessment Upper Extremity  Assessment: Defer to OT evaluation    Lower Extremity Assessment Lower Extremity Assessment: Overall WFL for tasks assessed    Cervical / Trunk Assessment Cervical / Trunk Assessment: Normal  Communication   Communication: No difficulties  Cognition Arousal/Alertness: Awake/alert Behavior During Therapy: WFL for tasks assessed/performed Overall Cognitive Status: Within Functional Limits for tasks assessed                      General Comments      Exercises     Assessment/Plan    PT Assessment Patient needs continued PT services  PT Problem List Decreased mobility;Decreased safety awareness;Decreased activity tolerance;Decreased balance;Decreased knowledge of use of DME          PT Treatment Interventions Gait training;Balance training;Therapeutic activities;Patient/family education;Functional mobility training    PT Goals (Current goals can be found in the Care Plan section)  Acute Rehab PT Goals Patient Stated Goal: return to independence PT Goal Formulation: With patient/family Time For Goal Achievement: 09/20/16 Potential to Achieve Goals: Good    Frequency Min 3X/week   Barriers to discharge        Co-evaluation               End of Session Equipment Utilized During Treatment: Gait belt Activity Tolerance: Patient tolerated treatment well Patient left: in chair;with call bell/phone within reach;with family/visitor present Nurse Communication: Mobility status;Precautions    Functional Assessment Tool Used: clinical judgement Functional Limitation: Mobility: Walking and moving around Mobility: Walking and Moving Around Current Status [phone]): At least 20 percent but less than 40 percent impaired, limited or restricted Mobility: Walking and Moving Around Goal Status [phone]): At least 1 percent but less than 20 percent impaired, limited or restricted    Time: [id number] PT Time Calculation (min) (ACUTE ONLY): 33 min   Charges:   PT  Evaluation $PT Eval Moderate Complexity: 1 Procedure     PT G Codes:   PT G-Codes **NOT FOR INPATIENT CLASS** Functional Assessment Tool Used: clinical judgement Functional Limitation: Mobility: Walking and moving around Mobility: Walking and Moving Around Current Status [id number]): At least 20 percent but less than 40 percent impaired, limited or restricted Mobility: Walking and Moving Around Goal Status [phone]): At least 1 percent but less than 20 percent impaired, limited or restricted    Jakim Drapeau B Telford Archambeau 09/13/2016, 2:19 PM  Elwyn Reach, [place]

## 2016-09-13 NOTE — Care Management Note (Signed)
Case Management Note  Patient Details  Name: Omar Reed MRN: [id number] Date of Birth: Aug 18, 1951  Subjective/Objective:  Pt admitted with CVA. He is from home with his spouse.                 Action/Plan: PT/OT recommending [facility] services. CM following for d/c needs.   Expected Discharge Date:                  Expected Discharge Plan:  [place]  In-House Referral:     Discharge planning Services     Post Acute Care Choice:    Choice offered to:     DME Arranged:    DME Agency:     HH Arranged:    [facility] Agency:     Status of Service:  In process, will continue to follow  If discussed at Long Length of Stay Meetings, dates discussed:    Additional Comments:  Pollie Friar, RN 09/13/2016, 2:37 PM

## 2016-09-13 NOTE — Progress Notes (Signed)
PROGRESS NOTE    Omar Reed  [id number] DOB: [birth year] DOA: 09/12/2016 PCP: Tivis Ringer, MD   Outpatient Specialists:     Brief Narrative:  Omar Reed is a 66 y.o. male with history of hypertension and sleep apnea presents to the ER with complaints of persistent diplopia and gait difficulties. Patient's symptoms started around 6 AM. Prior to that patient has been having some sensation of electrical activity going through his forehead. At 6:00 AM patient noticed that he has been having double vision and on trying to walk he was having unsteady gait. Patient presented to the ER later and had MRI of the brain which showed acute stroke involving the ocular motor nucleus. Neurologist on call was consulted. Patient was felt not be a candidate for TPA. Patient is being admitted for further stroke workup. Patient denies any difficulty speaking swallowing or any weakness in the upper or lower extremities. Patient still has diplopia. On exam patient is also not able to completely adduct his right eye.    Assessment & Plan:   Principal Problem:   Acute ischemic stroke [facility]) Active Problems:   Essential hypertension   Stroke (cerebrum) (HCC)   Acute ischemic stroke - right oculomotor nucleus -2-D echo and carotid Doppler -telemetry for arrhythmias -lipid panel: LDL 107- added lipitor -hemoglobin A1c -aspirin-- changed to plavix   Hypertension - allow for permissive hypertension due to stroke. Continue amlodipine and ARB.  History of sleep apnea - has not been recently using CPAP.   DVT prophylaxis:  Lovenox   Code Status: Full Code   Family Communication:   Disposition Plan:     Consultants:   neuro    Subjective: Still having trouble with vision-- working with OT  Objective: Vitals:   09/13/16 0400 09/13/16 0600 09/13/16 0800 09/13/16 0934  BP: 118/78 (!) 145/88 (!) 149/94   Pulse: 63 61 74   Resp: 18 17 16    Temp: 97.9 F (36.6 C) 97.6 F  (36.4 C) 97.7 F (36.5 C)   TempSrc: Oral Oral Oral   SpO2: 97% 96% 94%   Height:    5\' 4"  (1.626 m)    Intake/Output Summary (Last 24 hours) at 09/13/16 1405 Last data filed at 09/13/16 0236  Gross per 24 hour  Intake              235 ml  Output                0 ml  Net              235 ml   There were no vitals filed for this visit.  Examination:  General exam: Appears calm and comfortable  Respiratory system: Clear to auscultation. Respiratory effort normal. Cardiovascular system: S1 & S2 heard, RRR. No JVD, murmurs, rubs, gallops or clicks. No pedal edema. Gastrointestinal system: Abdomen is nondistended, soft and nontender. No organomegaly or masses felt. Normal bowel sounds heard. Central nervous system: Alert and oriented. No focal neurological deficits.    Data Reviewed: I have personally reviewed following labs and imaging studies  CBC:  Recent Labs Lab 09/12/16 1728 09/13/16 0423  WBC 7.8 5.0  NEUTROABS 6.4  --   HGB 14.6 13.4  HCT 43.2 40.4  MCV 88.9 89.8  PLT 219 0000000   Basic Metabolic Panel:  Recent Labs Lab 09/12/16 1728 09/13/16 0423  NA 141 142  K 4.5 3.9  CL 108 107  CO2 26 29  GLUCOSE 108* 102*  BUN  13 17  CREATININE 1.06 1.25*  CALCIUM 9.7 9.3   GFR: CrCl cannot be calculated (Unknown ideal weight.). Liver Function Tests:  Recent Labs Lab 09/12/16 1728 09/13/16 0423  AST 21 17  ALT 14* 13*  ALKPHOS 54 51  BILITOT 0.9 0.6  PROT 6.8 5.9*  ALBUMIN 4.2 3.6   No results for input(s): LIPASE, AMYLASE in the last 168 hours. No results for input(s): AMMONIA in the last 168 hours. Coagulation Profile: No results for input(s): INR, PROTIME in the last 168 hours. Cardiac Enzymes:  Recent Labs Lab 09/12/16 2325  TROPONINI <0.03   BNP (last 3 results) No results for input(s): PROBNP in the last 8760 hours. HbA1C: No results for input(s): HGBA1C in the last 72 hours. CBG: No results for input(s): GLUCAP in the last 168  hours. Lipid Profile:  Recent Labs  09/13/16 0423  CHOL 175  HDL 55  LDLCALC 107*  TRIG 65  CHOLHDL 3.2   Thyroid Function Tests: No results for input(s): TSH, T4TOTAL, FREET4, T3FREE, THYROIDAB in the last 72 hours. Anemia Panel: No results for input(s): VITAMINB12, FOLATE, FERRITIN, TIBC, IRON, RETICCTPCT in the last 72 hours. Urine analysis: No results found for: COLORURINE, APPEARANCEUR, LABSPEC, [place], GLUCOSEU, HGBUR, BILIRUBINUR, KETONESUR, PROTEINUR, UROBILINOGEN, NITRITE, LEUKOCYTESUR   )No results found for this or any previous visit (from the past 240 hour(s)).    Anti-infectives    None       Radiology Studies: Ct Head Wo Contrast  Result Date: 09/12/2016 CLINICAL DATA:  Ct head wo, pt has been feeling zapping sensation in head, blurred vision , x1 day EXAM: CT HEAD WITHOUT CONTRAST TECHNIQUE: Contiguous axial images were obtained from the base of the skull through the vertex without intravenous contrast. COMPARISON:  None. FINDINGS: Brain: No evidence of acute infarction, hemorrhage, hydrocephalus, extra-axial collection or mass lesion/mass effect. The ventricles are normal in configuration. There is ventricular sulcal enlargement consistent with mild atrophy. Vascular: No hyperdense vessel or unexpected calcification. Skull: Normal. Negative for fracture or focal lesion. Sinuses/Orbits: There changes from previous sinus surgery. There is mucosal thickening along the floor of the right maxillary sinus and involving several of the remaining ethmoid air cells and the inferior right frontal sinus. Mastoid air cells are clear. Globes and orbits are unremarkable. Other: None. IMPRESSION: 1. No acute intracranial abnormalities. 2. Mild atrophy. Electronically Signed   By: Lajean Manes M.D.   On: 09/12/2016 17:45   Mr Brain Wo Contrast  Result Date: 09/12/2016 CLINICAL DATA:  Diplopia. EXAM: MRI HEAD WITHOUT CONTRAST TECHNIQUE: Multiplanar, multiecho pulse sequences of the  brain and surrounding structures were obtained without intravenous contrast. COMPARISON:  CT head 09/12/2016 FINDINGS: Brain: 3 mm acute infarct in the right midbrain, immediately anterior and to the right of the cerebral aqueduct. This involves the oculomotor nucleus accounting for diplopia. No other acute infarct. No significant chronic ischemia. Negative for hemorrhage or mass. Incidental small arachnoid cyst in the right cerebellar pontine angle cistern. Pituitary normal in size. Vascular: Normal arterial flow voids. Skull and upper cervical spine: Negative Sinuses/Orbits: Mucosal edema paranasal sinuses.  Normal orbit. Other: None IMPRESSION: 3 mm acute infarct involving the oculomotor nucleus on the right. No other significant ischemia. Electronically Signed   By: Franchot Gallo M.D.   On: 09/12/2016 18:32   [name] Head/brain [id number] Cm  Result Date: 09/13/2016 CLINICAL DATA:  Acute brainstem infarct. EXAM: MRA HEAD WITHOUT CONTRAST TECHNIQUE: Angiographic images of the Circle of Willis were obtained using MRA technique  without intravenous contrast. COMPARISON:  MRI brain 09/12/2016 FINDINGS: The cervical left internal carotid artery is tortuous without focal stenosis. Internal carotid arteries are otherwise within normal limits from the high cervical segments through the ICA termini bilaterally. The A1 and M1 segments are normal. The anterior communicating artery is patent. MCA bifurcations are intact. ACA and MCA branch vessels are within normal limits. The right vertebral artery is the dominant vessel. The PICA origins are visualized and normal bilaterally. The basilar artery is small. Posterior cerebral arteries greasy equal contribution from posterior communicating arteries and P1 segments bilaterally. The PCA branch vessels are intact. IMPRESSION: 1. Normal variant MRA circle of Willis without significant proximal stenosis, aneurysm, or branch vessel occlusion. Electronically Signed   By: San Morelle M.D.   On: 09/13/2016 11:52        Scheduled Meds: . amLODipine  5 mg Oral QPC lunch  . atorvastatin  20 mg Oral q1800  . clopidogrel  75 mg Oral Daily  . darifenacin  7.5 mg Oral Daily  . enoxaparin (LOVENOX) injection  40 mg Subcutaneous Q24H  . famotidine  20 mg Oral BID  . fluticasone  2 spray Each Nare QHS  . ipratropium  2 spray Each Nare BID  . irbesartan  150 mg Oral QPC lunch  . multivitamin with minerals  1 tablet Oral QPC lunch  . tamsulosin  0.4 mg Oral QPC lunch   Continuous Infusions: . sodium chloride 1,000 mL (09/12/16 2328)     LOS: 0 days    Time spent: 25 min    [place], DO [facility] Pager [phone]  If 7PM-7AM, please contact night-coverage [email or web address] Password [email or web address] 09/13/2016, 2:05 PM

## 2016-09-13 NOTE — Care Management Note (Signed)
Case Management Note  Patient Details  Name: Omar Reed MRN: [id number] Date of Birth: 19-Nov-1950  Subjective/Objective:                    Action/Plan: Pt discharging home with orders for [facility] services. CM provided the patient and his wife with a list of [place] agencies. His wife selected [facility]. Santiago Glad with [facility] notified and accepted the referral.. Bedside RN updated.  Expected Discharge Date:                  Expected Discharge Plan:  [place]  In-House Referral:     Discharge planning Services  CM Consult  Post Acute Care Choice:  Home Health Choice offered to:  Spouse, Patient  DME Arranged:    DME Agency:     HH Arranged:  PT, OT HH Agency:  [place]  Status of Service:  Completed, signed off  If discussed at [place] of Stay Meetings, dates discussed:    Additional Comments:  Pollie Friar, RN 09/13/2016, 7:25 PM

## 2016-09-13 NOTE — Progress Notes (Signed)
STROKE TEAM PROGRESS NOTE   HISTORY OF PRESENT ILLNESS (per record) Omar Reed is an 66 y.o. male who presented with double vision and gait imbalance. Symptoms were preceded by sensation of "zinging" electrical shock-like sensations bitemporally, which woke him up from sleep. Also described as a "flashing in head" sensation. When he got up, he noticed binocular double vision (relieved by closing either eye) and wobbly gait with decreased sense of balance. Onset of symptoms was about 6 AM on Sunday 09/12/2016. He endorsed frontal headache but denied neck pain, chest pain, abdominal pain or limb pain. He has no prior history of stroke or MI. He exercises regularly, tries to eat healthy and does not smoke.   MRI revealed a 3 mm acute infarct involving the oculomotor nucleus on the right. No other significant ischemia was seen.  Patient was not administered IV t-PA due to delay in arrival. He was admitted for further evaluation and treatment.   SUBJECTIVE (INTERVAL HISTORY) His wife is at the bedside.  Overall he feels his condition is stable. He recounted the HPI with DR. Threasa Kinch - visual abnormalities, gait instability, etc.   OBJECTIVE Temp:  [97.4 F (36.3 C)-98.3 F (36.8 C)] 97.7 F (36.5 C) (01/08 0800) Pulse Rate:  [61-87] 74 (01/08 0800) Cardiac Rhythm: Normal sinus rhythm (01/08 0710) Resp:  [10-18] 16 (01/08 0800) BP: (118-172)/(74-97) 149/94 (01/08 0800) SpO2:  [94 %-100 %] 94 % (01/08 0800)  CBC:   Recent Labs Lab 09/12/16 1728 09/13/16 0423  WBC 7.8 5.0  NEUTROABS 6.4  --   HGB 14.6 13.4  HCT 43.2 40.4  MCV 88.9 89.8  PLT 219 0000000    Basic Metabolic Panel:   Recent Labs Lab 09/12/16 1728 09/13/16 0423  NA 141 142  K 4.5 3.9  CL 108 107  CO2 26 29  GLUCOSE 108* 102*  BUN 13 17  CREATININE 1.06 1.25*  CALCIUM 9.7 9.3    Lipid Panel:     Component Value Date/Time   CHOL 175 09/13/2016 0423   TRIG 65 09/13/2016 0423   HDL 55 09/13/2016 0423   CHOLHDL  3.2 09/13/2016 0423   VLDL 13 09/13/2016 0423   LDLCALC 107 (H) 09/13/2016 0423   HgbA1c: No results found for: HGBA1C Urine Drug Screen: No results found for: LABOPIA, COCAINSCRNUR, LABBENZ, AMPHETMU, THCU, LABBARB    IMAGING  Ct Head Wo Contrast 09/12/2016 1. No acute intracranial abnormalities. 2. Mild atrophy.   Mr Brain Wo Contrast 09/12/2016 3 mm acute infarct involving the oculomotor nucleus on the right. No other significant ischemia.   [name] Head/brain Wo Cm 09/13/2016 1. Normal variant MRA circle of Willis without significant proximal stenosis, aneurysm, or branch vessel occlusion.     PHYSICAL EXAM Pleasant middle-age Caucasian male currently not in distress. . Afebrile. Head is nontraumatic. Neck is supple without bruit.    Cardiac exam no murmur or gallop. Lungs are clear to auscultation. Distal pulses are well felt. Neurological Exam :  Awake alert oriented 3 with normal speech and language function. No aphasia, apraxia or dysarthria. Right internuclear ophthalmoplegia with weakness of adduction of the right eye with a few beats of gaze evoked nystagmus in the left eye on lateral gaze. Intact vertical gaze. Face is symmetric without weakness. Tongue midline. Motor system exam symmetric upper and lower extremity strength without focal weakness. Deep tendon reflexes are symmetric. Plantars are downgoing. Sensation and coordination intact. Gait was not tested. ASSESSMENT/PLAN [name] is a 66 y.o.  male with history of HTN and obstructive sleep apnea presenting with diplopia and gait difficulties. He did not receive IV t-PA.   Stroke:  R oculomotor nucleus infarct secondary to small vessel disease source  Resultant  R INO  MRI  R oculomotor nucleus infarct  MRA  normal  Carotid Doppler  pending   2D Echo  pending   LDL 107  HgbA1c pending  Lovenox 40 mg sq daily for VTE prophylaxis Diet Heart Room service appropriate? Yes; Fluid consistency:  Thin  aspirin 81 mg daily prior to admission, now on aspirin 325 mg daily. Given stroke on aspirin, will change to plavix.  Patient counseled to be compliant with his antithrombotic medications  Ongoing aggressive stroke risk factor management  Therapy recommendations:  HH OT  Disposition:  pending   Follow up with stroke clinic in 6 weeks. Order written. Office will call with appt date and time.  Hypertension  Stable  Permissive hypertension (OK if < 220/120) but gradually normalize in 5-7 days  Long-term BP goal normotensive  Hyperlipidemia  Home meds:  No statin  LDL 107, goal < 70  Add lipitor 20 mg daily  Continue statin at discharge  Other Stroke Risk Factors  Advanced age  obstructive sleep apnea   Other Active Problems  Mildly elevated creatinine 1.25  Hospital day # 0  Radene Journey [facility] for Pager information 09/13/2016 1:01 PM  I have personally examined this patient, reviewed notes, independently viewed imaging studies, participated in medical decision making and plan of care.ROS completed by me personally and pertinent positives fully documented  I have made any additions or clarifications directly to the above note. Agree with note above. I had a long discussion with the patient and his wife regarding his small brainstem stroke and diplopia. Recommend changing aspirin to Plavix for stroke prevention and aggressive risk factor modification. Recommend using an eye patch alternatingly over one eye and to limit his driving. Greater than 50% time during this study foramina to visit was spent on counseling and coordination of care about stroke risk, prevention and treatment  Antony Contras, MD Medical Director [place] Pager: [phone] 09/13/2016 3:56 PM  To contact Stroke Continuity provider, please refer to [email or web address] After hours, contact General Neurology

## 2016-09-13 NOTE — Progress Notes (Signed)
  Echocardiogram 2D Echocardiogram has been performed.  Omar Reed 09/13/2016, 3:13 PM

## 2016-09-13 NOTE — Discharge Summary (Signed)
Physician Discharge Summary  Omar Reed [id number] DOB: 03/07/1951 DOA: 09/12/2016  PCP: Tivis Ringer, MD  Admit date: 09/12/2016 Discharge date: 09/13/2016   Recommendations for Outpatient Follow-Up:   Outpatient neuro follow up Patient given 30 days supply of new medications-- if tolerates them, would like 90 day supply sent to [place] [facility] PT/OT  Discharge Diagnosis:   Principal Problem:   Acute ischemic stroke [facility]) Active Problems:   Essential hypertension   Stroke (cerebrum) [facility])   Discharge disposition:  Home.   Discharge Condition: Improved.  Diet recommendation: Low sodium, heart healthy.   Wound care: None.   History of Present Illness:   Omar Reed is a 66 y.o. male with history of hypertension and sleep apnea presents to the ER with complaints of persistent diplopia and gait difficulties. Patient's symptoms started around 6 AM. Prior to that patient has been having some sensation of electrical activity going through his forehead. At 6:00 AM patient noticed that he has been having double vision and on trying to walk he was having unsteady gait. Patient presented to the ER later and had MRI of the brain which showed acute stroke involving the ocular motor nucleus. Neurologist on call was consulted. Patient was felt not be a candidate for TPA. Patient is being admitted for further stroke workup. Patient denies any difficulty speaking swallowing or any weakness in the upper or lower extremities. Patient still has diplopia. On exam patient is also not able to completely adduct his right eye.    Hospital Course by Problem:   Acute ischemic stroke- right oculomotor nucleus  -2-D echo and carotid Doppler: Left ventricle: The cavity size was normal. Wall thickness was    increased in a pattern of mild LVH. Systolic function was normal.    The estimated ejection fraction was in the range of 60% to 65%.    Doppler parameters are  consistent with abnormal left ventricular    relaxation (grade 1 diastolic dysfunction). - Mitral valve: There was mild regurgitation -lipid panel: LDL 107- added lipitor- FLP/LFTS 6 weeks -hemoglobin A1c: 5.5 -aspirin-- changed to plavix  -alternate patch q 4 hours  Hypertension- allow for permissive hypertension due to stroke 5-7 days. Continue amlodipine and ARB.  History of sleep apnea- has not been recently using CPAP.    Medical Consultants:   Neuro  Discharge Exam:   Vitals:   09/13/16 0600 09/13/16 0800  BP: (!) 145/88 (!) 149/94  Pulse: 61 74  Resp: 17 16  Temp: 97.6 F (36.4 C) 97.7 F (36.5 C)   Vitals:   09/13/16 0400 09/13/16 0600 09/13/16 0800 09/13/16 0934  BP: 118/78 (!) 145/88 (!) 149/94   Pulse: 63 61 74   Resp: 18 17 16    Temp: 97.9 F (36.6 C) 97.6 F (36.4 C) 97.7 F (36.5 C)   TempSrc: Oral Oral Oral   SpO2: 97% 96% 94%   Weight:    64.8 kg (142 lb 12.8 oz)  Height:    5\' 4"  (1.626 m)    Gen:  NAD   The results of significant diagnostics from this hospitalization (including imaging, microbiology, ancillary and laboratory) are listed below for reference.     Procedures and Diagnostic Studies:   Ct Head Wo Contrast  Result Date: 09/12/2016 CLINICAL DATA:  Ct head wo, pt has been feeling zapping sensation in head, blurred vision , x1 day EXAM: CT HEAD WITHOUT CONTRAST TECHNIQUE: Contiguous axial images were obtained from the base of the  skull through the vertex without intravenous contrast. COMPARISON:  None. FINDINGS: Brain: No evidence of acute infarction, hemorrhage, hydrocephalus, extra-axial collection or mass lesion/mass effect. The ventricles are normal in configuration. There is ventricular sulcal enlargement consistent with mild atrophy. Vascular: No hyperdense vessel or unexpected calcification. Skull: Normal. Negative for fracture or focal lesion. Sinuses/Orbits: There changes from previous sinus surgery. There is mucosal  thickening along the floor of the right maxillary sinus and involving several of the remaining ethmoid air cells and the inferior right frontal sinus. Mastoid air cells are clear. Globes and orbits are unremarkable. Other: None. IMPRESSION: 1. No acute intracranial abnormalities. 2. Mild atrophy. Electronically Signed   By: Lajean Manes M.D.   On: 09/12/2016 17:45   Mr Brain Wo Contrast  Result Date: 09/12/2016 CLINICAL DATA:  Diplopia. EXAM: MRI HEAD WITHOUT CONTRAST TECHNIQUE: Multiplanar, multiecho pulse sequences of the brain and surrounding structures were obtained without intravenous contrast. COMPARISON:  CT head 09/12/2016 FINDINGS: Brain: 3 mm acute infarct in the right midbrain, immediately anterior and to the right of the cerebral aqueduct. This involves the oculomotor nucleus accounting for diplopia. No other acute infarct. No significant chronic ischemia. Negative for hemorrhage or mass. Incidental small arachnoid cyst in the right cerebellar pontine angle cistern. Pituitary normal in size. Vascular: Normal arterial flow voids. Skull and upper cervical spine: Negative Sinuses/Orbits: Mucosal edema paranasal sinuses.  Normal orbit. Other: None IMPRESSION: 3 mm acute infarct involving the oculomotor nucleus on the right. No other significant ischemia. Electronically Signed   By: Franchot Gallo M.D.   On: 09/12/2016 18:32   [name] Head/brain [id number] Cm  Result Date: 09/13/2016 CLINICAL DATA:  Acute brainstem infarct. EXAM: MRA HEAD WITHOUT CONTRAST TECHNIQUE: Angiographic images of the Circle of Willis were obtained using MRA technique without intravenous contrast. COMPARISON:  MRI brain 09/12/2016 FINDINGS: The cervical left internal carotid artery is tortuous without focal stenosis. Internal carotid arteries are otherwise within normal limits from the high cervical segments through the ICA termini bilaterally. The A1 and M1 segments are normal. The anterior communicating artery is patent. MCA  bifurcations are intact. ACA and MCA branch vessels are within normal limits. The right vertebral artery is the dominant vessel. The PICA origins are visualized and normal bilaterally. The basilar artery is small. Posterior cerebral arteries greasy equal contribution from posterior communicating arteries and P1 segments bilaterally. The PCA branch vessels are intact. IMPRESSION: 1. Normal variant MRA circle of Willis without significant proximal stenosis, aneurysm, or branch vessel occlusion. Electronically Signed   By: San Morelle M.D.   On: 09/13/2016 11:52     Labs:   Basic Metabolic Panel:  Recent Labs Lab 09/12/16 1728 09/13/16 0423  NA 141 142  K 4.5 3.9  CL 108 107  CO2 26 29  GLUCOSE 108* 102*  BUN 13 17  CREATININE 1.06 1.25*  CALCIUM 9.7 9.3   GFR Estimated Creatinine Clearance: 49.3 mL/min (by C-G formula based on SCr of 1.25 mg/dL (H)). Liver Function Tests:  Recent Labs Lab 09/12/16 1728 09/13/16 0423  AST 21 17  ALT 14* 13*  ALKPHOS 54 51  BILITOT 0.9 0.6  PROT 6.8 5.9*  ALBUMIN 4.2 3.6   No results for input(s): LIPASE, AMYLASE in the last 168 hours. No results for input(s): AMMONIA in the last 168 hours. Coagulation profile No results for input(s): INR, PROTIME in the last 168 hours.  CBC:  Recent Labs Lab 09/12/16 1728 09/13/16 0423  WBC 7.8 5.0  NEUTROABS 6.4  --  HGB 14.6 13.4  HCT 43.2 40.4  MCV 88.9 89.8  PLT 219 204   Cardiac Enzymes:  Recent Labs Lab 09/12/16 2325  TROPONINI <0.03   BNP: Invalid input(s): POCBNP CBG: No results for input(s): GLUCAP in the last 168 hours. D-Dimer No results for input(s): DDIMER in the last 72 hours. Hgb A1c No results for input(s): HGBA1C in the last 72 hours. Lipid Profile  Recent Labs  09/13/16 0423  CHOL 175  HDL 55  LDLCALC 107*  TRIG 65  CHOLHDL 3.2   Thyroid function studies No results for input(s): TSH, T4TOTAL, T3FREE, THYROIDAB in the last 72 hours.  Invalid  input(s): FREET3 Anemia work up No results for input(s): VITAMINB12, FOLATE, FERRITIN, TIBC, IRON, RETICCTPCT in the last 72 hours. Microbiology No results found for this or any previous visit (from the past 240 hour(s)).   Discharge Instructions:   Discharge Instructions    Ambulatory referral to Neurology    Complete by:  As directed    Stroke patient. Dr. Leonie Man prefers follow up in 6 weeks   Diet - low sodium heart healthy    Complete by:  As directed    Discharge instructions    Complete by:  As directed    Patches- per Dr. [name] instruction (q 4 hours) No driving until seen by neuro   Increase activity slowly    Complete by:  As directed      Allergies as of 09/13/2016      Reactions   Griseofulvin Other (See Comments)   Severe headaches      Medication List    STOP taking these medications   aspirin EC 81 MG tablet     TAKE these medications   amLODipine 5 MG tablet Commonly known as:  NORVASC Take 5 mg by mouth daily after lunch.   atorvastatin 20 MG tablet Commonly known as:  LIPITOR Take 1 tablet (20 mg total) by mouth daily at 6 PM.   BIOTENE DRY MOUTH MT Use as directed 15 mLs in the mouth or throat at bedtime. Swish and spit   clopidogrel 75 MG tablet Commonly known as:  PLAVIX Take 1 tablet (75 mg total) by mouth daily. Start taking on:  09/14/2016   fluticasone 50 MCG/ACT nasal spray Commonly known as:  FLONASE Place 2 sprays into both nostrils at bedtime.   ipratropium 0.03 % nasal spray Commonly known as:  ATROVENT Place 2 sprays into both nostrils See admin instructions. Instill 2 sprays into each nare twice daily - morning and mid-afternoon   irbesartan 150 MG tablet Commonly known as:  AVAPRO Take 150 mg by mouth daily after lunch.   MOUTHWASH MT Use as directed in the mouth or throat See admin instructions. Swish and spit Crest mouthwash when awakened during the night - 2-3 times nightly - for dry mouth   multivitamin with minerals  Tabs tablet Take 1 tablet by mouth daily after lunch. Centrum Silver   polyethylene glycol packet Commonly known as:  MIRALAX / GLYCOLAX Take 17 g by mouth daily as needed (constipation). Mix in 4-8 oz water and drink   ranitidine 150 MG tablet Commonly known as:  ZANTAC Take 150 mg by mouth daily before breakfast.   solifenacin 10 MG tablet Commonly known as:  VESICARE Take 10 mg by mouth daily after lunch.   tamsulosin 0.4 MG Caps capsule Commonly known as:  FLOMAX Take 0.4 mg by mouth daily after lunch.      Follow-up Information  SETHI,PRAMOD, MD Follow up in 6 week(s).   Specialties:  Neurology, Radiology Why:  stroke clinic. office will call with appt date and time Contact information: [place] [address] Landa, MD Follow up in 1 week(s).   Specialty:  Internal Medicine Why:  for BP check Contact information: [address] [place] [address] [phone]            Time coordinating discharge: 35 min  Signed:  JESSICA Alison Stalling   [facility] 09/13/2016, 5:36 PM

## 2016-09-13 NOTE — Progress Notes (Signed)
PT Cancellation Note  Patient Details Name: Omar Reed MRN: [id number] DOB: [birth year]   Cancelled Treatment:    Reason Eval/Treat Not Completed: Patient at procedure or test/unavailable.   Pt off unit for testing. Will check back as schedule allows to complete PT eval.    [name] 09/13/2016, 10:55 AM   Rolinda Roan, PT, DPT Acute Rehabilitation Services Pager: [phone]

## 2016-09-13 NOTE — Progress Notes (Signed)
Occupational Therapy Evaluation Patient Details Name: Omar Reed MRN: [id number] DOB: 1951/06/13 Today's Date: 09/13/2016    History of Present Illness Pt admitted with diplopia, dizziness and difficulty walking. + R oculomotor nucleus infarct. PMH: HTN, sleep apnea.    Clinical Impression   Pt is and active and independent retired man who presents with diplopia and therefore impaired balance impacting his ability to perform self care and mobility. Educated pt in safety and used taping technique on his eye glasses. Pt currently requiring min assist for mobility and ADL. Session limited by pt having to go to a test. Will follow acutely.    Follow Up Recommendations  Home health OT    Equipment Recommendations   (RW)    Recommendations for Other Services       Precautions / Restrictions Precautions Precautions: Fall Precaution Comments: diplopia      Mobility Bed Mobility Overal bed mobility: Needs Assistance Bed Mobility: Supine to Sit     Supine to sit: Supervision        Transfers Overall transfer level: Needs assistance Equipment used: 1 person hand held assist Transfers: Sit to/from Stand;Stand Pivot Transfers Sit to Stand: Min assist Stand pivot transfers: Min assist       General transfer comment: performed x 2    Balance Overall balance assessment: Needs assistance Sitting-balance support: Feet supported Sitting balance-Leahy Scale: Fair       Standing balance-Leahy Scale: Poor                              ADL Overall ADL's : Needs assistance/impaired Eating/Feeding: Independent;Bed level   Grooming: Wash/dry hands;Wash/dry face;Sitting;Supervision/safety   Upper Body Bathing: Supervision/ safety;Sitting   Lower Body Bathing: Minimal assistance;Sit to/from stand   Upper Body Dressing : Supervision/safety;Sitting   Lower Body Dressing: Minimal assistance;Sit to/from stand   Toilet Transfer: Minimal  assistance;Stand-pivot   Toileting- Clothing Manipulation and Hygiene: Minimal assistance;Sit to/from stand               Vision Vision Assessment?: Yes Eye Alignment: Within Functional Limits Tracking/Visual Pursuits: Right eye does not track medially Diplopia Assessment: Present in far gaze;Present in near gaze;Objects split side to side;Present all the time/all directions Depth Perception: Overshoots;Undershoots Additional Comments: Pt right eye dominant with testing, taped L lens of glasses and instructed to turn head to decrease diplopia. Recommended neuro-opthamology consult.    Perception     Praxis      Pertinent Vitals/Pain Pain Assessment: No/denies pain     Hand Dominance Right   Extremity/Trunk Assessment Upper Extremity Assessment Upper Extremity Assessment: Overall WFL for tasks assessed   Lower Extremity Assessment Lower Extremity Assessment: Defer to PT evaluation   Cervical / Trunk Assessment Cervical / Trunk Assessment: Normal   Communication Communication Communication: No difficulties   Cognition Arousal/Alertness: Awake/alert Behavior During Therapy: WFL for tasks assessed/performed Overall Cognitive Status: Within Functional Limits for tasks assessed                     General Comments       Exercises       Shoulder Instructions      Home Living Family/patient expects to be discharged to:: Private residence Living Arrangements: Spouse/significant other Available Help at Discharge: Family;Available PRN/intermittently (wife works) Type of Home: House Home Access: Stairs to enter [employer] of Steps: 4 Entrance Stairs-Rails: None Home Layout: Two level;Able to live on main level  with bedroom/bathroom Chiropractor bedroom is on second floor) Alternate Level Stairs-Number of Steps: flight   Bathroom Shower/Tub: Occupational psychologist: Standard     Home Equipment: Bedside commode          Prior  Functioning/Environment Level of Independence: Independent        Comments: active, retired        Secretary/administrator Problem List: Impaired balance (sitting and/or standing);Impaired vision/perception;Decreased knowledge of use of DME or AE   OT Treatment/Interventions: DME and/or AE instruction;Therapeutic activities;Visual/perceptual remediation/compensation;Patient/family education;Balance training;Self-care/ADL training    OT Goals(Current goals can be found in the care plan section) Acute Rehab OT Goals Patient Stated Goal: return to independence OT Goal Formulation: With patient Time For Goal Achievement: 09/20/16 Potential to Achieve Goals: Good ADL Goals Pt Will Perform Grooming: with supervision;standing Pt Will Perform Lower Body Bathing: with supervision;sit to/from stand Pt Will Perform Lower Body Dressing: with supervision;sit to/from stand Pt Will Transfer to Toilet: with supervision;ambulating;regular height toilet Pt Will Perform Toileting - Clothing Manipulation and hygiene: with supervision;sit to/from stand Pt Will Perform Tub/Shower Transfer: Shower transfer;with supervision;ambulating;3 in 1;rolling walker Additional ADL Goal #1: Pt will gather items for ADL with supervision at AD as needed. Additional ADL Goal #2: Pt will utilize compensatory strategies for visual deficits independently.  OT Frequency: Min 2X/week   Barriers to D/C:            Co-evaluation              End of Session Equipment Utilized During Treatment: Gait belt  Activity Tolerance: Patient tolerated treatment well Patient left: in bed;with call bell/phone within reach (to go to test)   Time: [id number] OT Time Calculation (min): 35 min Charges:  OT General Charges $OT Visit: 1 Procedure OT Evaluation $OT Eval Moderate Complexity: 1 Procedure OT Treatments $Therapeutic Activity: 8-22 mins G-Codes: OT G-codes **NOT FOR INPATIENT CLASS** Functional Assessment Tool Used: clinical  judgement Functional Limitation: Self care Self Care Current Status [id number]): At least 20 percent but less than 40 percent impaired, limited or restricted Self Care Goal Status [id number]): At least 1 percent but less than 20 percent impaired, limited or restricted  Malka So 09/13/2016, 12:12 PM  [phone]

## 2016-09-14 LAB — HEMOGLOBIN A1C
HEMOGLOBIN A1C: 5.5 % (ref 4.8–5.6)
Mean Plasma Glucose: 111 mg/dL

## 2016-09-16 ENCOUNTER — Other Ambulatory Visit: Payer: Self-pay

## 2016-09-16 ENCOUNTER — Telehealth: Payer: Self-pay | Admitting: Neurology

## 2016-09-16 NOTE — Telephone Encounter (Signed)
Patients wife called to schedule a hospital fu appt was given for 11/02/16 with Dr. Leonie Man.  Per wife she does not feel comfortable waiting 8 weeks after her husbands stroke.  I have advised her I have added patient to a waiting list due to availability with provider.  Wife would like to speak with a nurse to see about scheduling an appt sooner.

## 2016-09-16 NOTE — Patient Outreach (Signed)
[facility]) Care Management  09/16/2016  Omar Reed Dec 19, 1950 [id number]     EMMI-Stroke RED ON EMMI ALERT Day # 1 Date: 09/15/16 Red Alert Reason: "Feeling worse overall? Yes"    Outreach attempt #1 to patient. Spoke with patient. Reviewed and discussed red alert. Patient reports that machine misunderstood him. He denies feeling worse. He states he is doing fine since discharge home. He continues to have double vision but was advised that his may persist. patient lives with spouse and has support system. [facility] HHPT/OT are working with patient and he has visit scheduled for later today. He has PCP f/u next week. He will call to make f/u appt with Dr. Leonie Man in 6wks. Patient concerned and questioning if error on discharge paper for him to wait 6wks for f/u with neuro. Reassured him that this was the standard and norm unless patient was having worsening or changes in condition. He voiced understanding. He has transportation to appts as he has not been medically cleared to drive yet. patient state he has all his meds. Denies any questions,issues or concerns with meds. He is checking BP at least daily in the home and recording in log. He voices no RN CM needs or concerns at this time. RN CM will send further stroke education to patient via mail.     Plan: RN CM will notify [facility] administrative assistant of case status. RN CM will send educational info on post-stroke care to patient.   Enzo Montgomery, RN,BSN,CCM [place] Management Telephonic Care Management Coordinator Direct Phone: [phone] Toll Free: [phone] Fax: [phone]

## 2016-09-16 NOTE — Telephone Encounter (Signed)
Rn call patients wife back to give her a different appt time of 0230pm and check in at 0200pm. Rn stated she will get two appt reminder callls prior to appt. PTs wife verbalized understanding.

## 2016-09-16 NOTE — Telephone Encounter (Signed)
Rn call patients wife about a sooner appt than 11/02/2016. Rn explain to patients wife Omar Reed that Dr. Leonie Man works in the office every other week. Rn stated the discharge did state 6 weeks,but sometimes pt can be schedule out till 8 weeks. Rn stated pt can see another provider for the initial visit, and than see Dr. Tilden Dome ongoing. Rn gave pts wife appt of 10/18/2016 at 0300pm. arrive at 0230pm for check in. Rn gave pt address and name of office location. Terrence Dupont RN was present during the conversation with pts wife. Pt's wife verbalized understanding.

## 2016-09-24 ENCOUNTER — Other Ambulatory Visit: Payer: Self-pay

## 2016-09-24 NOTE — Patient Outreach (Signed)
[facility]) Care Management  09/24/2016  Omar Reed October 17, 1950 [id number]      EMMI-Stroke RED ON EMMI ALERT Day # 9 Date: 09/23/16 Red Alert Reason: "Questions/problems with meds? Yes"    Outreach attempt #1 to patient. Reviewed and addressed red alert with patient. Patient states "once aging the machine misunderstood me.' He denies any problems or concerns with meds. He has all meds and has been taking them as ordered. He states that he is feeling a little better and has noticed that his balance is returning and improving some. He continues to have double vision and is wearing eye patch and alternating q4hrs as advised. F/U appts in place. He denies any further needs or concerns at this time.   Plan: RN CM will notify [facility] administrative assistant of case status.   Enzo Montgomery, RN,BSN,CCM [facility] Telephonic Care Management Coordinator Direct Phone: [phone] Toll Free: [phone] Fax: [phone]

## 2016-09-30 ENCOUNTER — Telehealth: Payer: Self-pay | Admitting: Internal Medicine

## 2016-09-30 NOTE — Telephone Encounter (Signed)
Omar Reed knows me through his wife. He had stroke and he really wants to see you on or before Oct 18, 2016 or that week . They are seeing Bonnita Levan but wife said is a measure to be seen by office doc before ultimately seeing you. They are hoping you could see him on or before 10/18/16. He is free week of feb 5th or feb 12th but for dates of 5th and 9th he is not frree  Thanks  Dr. Brand Males, M.D., [facility].C.P Pulmonary and Critical Care Medicine Staff Physician [facility] Pager: [phone], If no answer or between  15:00h - 7:00h: call [phone]  09/30/2016 2:35 PM

## 2016-09-30 NOTE — Telephone Encounter (Signed)
Vm was left on pts wife [name] cell number to call back that their was a appt  available on 10/18/2016 at 1200 and to check in at 1130. Left vm for wife to confirm it appt time was okay to schedule.

## 2016-09-30 NOTE — Telephone Encounter (Signed)
Omar Reed I will try to see him

## 2016-10-01 NOTE — Telephone Encounter (Signed)
Pta wife to call back to confirm appt on 10/18/2016 with Dr. Leonie Man at 1200pm and check in at 1130. PT was originally  schedule to see Dr.Penumalli for the hospital follow up. PT requested to see Dr. Leonie Man. Dr. Leonie Man will see pt the same day at 1200pm.

## 2016-10-18 ENCOUNTER — Telehealth: Payer: Self-pay

## 2016-10-18 ENCOUNTER — Ambulatory Visit (INDEPENDENT_AMBULATORY_CARE_PROVIDER_SITE_OTHER): Payer: Commercial Managed Care - PPO | Admitting: Neurology

## 2016-10-18 ENCOUNTER — Encounter: Payer: Self-pay | Admitting: Neurology

## 2016-10-18 ENCOUNTER — Ambulatory Visit: Payer: Self-pay | Admitting: Diagnostic Neuroimaging

## 2016-10-18 VITALS — BP 124/83 | HR 72 | Ht 64.0 in | Wt 151.8 lb

## 2016-10-18 DIAGNOSIS — H532 Diplopia: Secondary | ICD-10-CM | POA: Diagnosis not present

## 2016-10-18 NOTE — Progress Notes (Signed)
[facility] [address] [place]. [place] [address] [phone]       OFFICE FOLLOW-UP NOTE  [name] Date of Birth:  1951-04-13 Medical Record Number:  [id number]   HPI: Omar Reed is a 66 year old male seen today for the first office follow-up visit following hospital admission for stroke in January 2017. He is accompanied by his wife. History is obtained from the patient, wife and review of hospital medical records. He presented on 09/12/2016 with double vision and gait imbalance. Symptoms were preceded by sensation of "zinging" electrical shock-like sensations bitemporally, which woke him up from sleep. Also described as a "flashing in head" sensation. When he got up, he noticed binocular double vision (relieved by closing either eye) and wobbly gait with decreased sense of balance. Onset of symptoms was about 6 AM on Sunday 09/12/2016. He endorsed frontal headache but denied neck pain, chest pain, abdominal pain or limb pain. He had no prior history of stroke or MI. He exercises regularly, tries to eat healthy and does not smoke. MRI  personally reviewedrevealeda 3 mm acute infarct involving the oculomotor nucleus on the right. No other significant ischemia was seen.Patient was not administered IV t-PA due to delay in arrival. He was admitted for further evaluation and treatment. MRA brain showed no large vessel intracranial stenosis. LDL cholesterol is elevated at 107 mg percent. Carotid Doppler showed no significant extracranial stenosis. Transthoracic echo showed normal ejection fraction without cardiac source of embolism. Hemoglobin A1c was 5.5. Patient had persistent diplopia particularly with the vertical gaze. He was advised to wear an eye patch. He states his diplopia still persist he has trouble mainly when he looks down or up close to his eyes. He does with by patch intermittently. He does have an upcoming appointment with the ophthalmologist from [facility] on February  27 to discuss prisms. He is tolerating Plavix without bruising or bleeding. He states his blood pressure is well controlled and today it is 1-4/83. Is tolerating Lipitor well without muscle aches and pains. He has no other new complaints today.  ROS:   14 system review of systems is positive for diplopia only and all other systems negative  PMH:  Past Medical History:  Diagnosis Date  . Anxiety   . Hypertension   . Stroke [facility])     Social History:  Social History   Social History  . Marital status: Married    Spouse name: N/A  . Number of children: 2  . Years of education: N/A   Occupational History  . retired    Social History Main Topics  . Smoking status: Never Smoker  . Smokeless tobacco: Never Used  . Alcohol use No  . Drug use: No  . Sexual activity: Not on file   Other Topics Concern  . Not on file   Social History Narrative  . No narrative on file    Medications:   Current Outpatient Prescriptions on File Prior to Visit  Medication Sig Dispense Refill  . amLODipine (NORVASC) 5 MG tablet Take 5 mg by mouth daily after lunch.     Marland Kitchen atorvastatin (LIPITOR) 20 MG tablet Take 1 tablet (20 mg total) by mouth daily at 6 PM. 30 tablet 0  . clopidogrel (PLAVIX) 75 MG tablet Take 1 tablet (75 mg total) by mouth daily. 30 tablet 0  . fluticasone (FLONASE) 50 MCG/ACT nasal spray Place 2 sprays into both nostrils at bedtime.    Marland Kitchen ipratropium (ATROVENT) 0.03 % nasal spray  Place 2 sprays into both nostrils See admin instructions. Instill 2 sprays into each nare twice daily - morning and mid-afternoon    . irbesartan (AVAPRO) 150 MG tablet Take 150 mg by mouth daily after lunch.    . Mouthwashes (BIOTENE DRY MOUTH MT) Use as directed 15 mLs in the mouth or throat at bedtime. Swish and spit    . Multiple Vitamin (MULTIVITAMIN WITH MINERALS) TABS tablet Take 1 tablet by mouth daily after lunch. Centrum Silver    . polyethylene glycol (MIRALAX / GLYCOLAX) packet Take 17 g by  mouth daily as needed (constipation). Mix in 4-8 oz water and drink    . ranitidine (ZANTAC) 150 MG tablet Take 150 mg by mouth daily before breakfast.    . solifenacin (VESICARE) 10 MG tablet Take 10 mg by mouth daily after lunch.    . tamsulosin (FLOMAX) 0.4 MG CAPS capsule Take 0.4 mg by mouth daily after lunch.     No current facility-administered medications on file prior to visit.     Allergies:   Allergies  Allergen Reactions  . Griseofulvin Other (See Comments)    Severe headaches    Physical Exam General: well developed, well nourished middle-age Caucasian male, seated, in no evident distress Head: head normocephalic and atraumatic.  Neck: supple with no carotid or supraclavicular bruits Cardiovascular: regular rate and rhythm, no murmurs Musculoskeletal: no deformity Skin:  no rash/petichiae Vascular:  Normal pulses all extremities Vitals:   10/18/16 1216  BP: 124/83  Pulse: 72   Neurologic Exam Mental Status: Awake and fully alert. Oriented to place and time. Recent and remote memory intact. Attention span, concentration and fund of knowledge appropriate. Mood and affect appropriate.  Cranial Nerves: Fundoscopic exam reveals sharp disc margins. Pupils equal, briskly reactive to light. Extraocular movements show slight restriction of vertical gaze and subjective diplopia on downgaze and with accommodation but no nystagmus. Visual fields full to confrontation. Hearing intact. Facial sensation intact. Face, tongue, palate moves normally and symmetrically.  Motor: Normal bulk and tone. Normal strength in all tested extremity muscles. Sensory.: intact to touch ,pinprick .position and vibratory sensation.  Coordination: Rapid alternating movements normal in all extremities. Finger-to-nose and heel-to-shin performed accurately bilaterally. Gait and Station: Arises from chair without difficulty. Stance is normal. Gait demonstrates normal stride length and balance . Able to heel,  toe and tandem walk without difficulty.  Reflexes: 1+ and symmetric. Toes downgoing.   NIHSS  0 Modified Rankin  2  ASSESSMENT: 39 year Caucasian male with right ventral mid brain infarct secondary to small vessel disease in January 2017 with persistent diplopia. Vascular risk factors of hypertension, hyperlipidemia.    PLAN: I had a long d/w patient and his wife about his recent  lacunar stroke, risk for recurrent stroke/TIAs, personally independently reviewed imaging studies and stroke evaluation results and answered questions.Continue Plavix  for secondary stroke prevention and maintain strict control of hypertension with blood pressure goal below 130/90,    and lipids with LDL cholesterol goal below 70 mg/dL. I also advised the patient to eat a healthy diet with plenty of whole grains, cereals, fruits and vegetables, exercise regularly and maintain ideal body weight. I advised him not to drive till his diplopia improves. He was advised to keep his upcoming appointment with ophthalmologist to discuss prisms for his diplopia. He was advised to wear an eye patch while reading and being outdoors. Followup in the future with my nurse practitioner in 6 months or call earlier if necessary  Greater than 50% of time during this 25 minute visit was spent on counseling,explanation of diagnosis of lacunar stroke, diplopia and prisms discussion, planning of further management,   and coordination of care Antony Contras, MD  [facility] [address] [place], [place] [address]  Phone [phone] Fax [phone] Note: This document was prepared with digital dictation and possible smart phrase technology. Any transcriptional errors that result from this process are unintentional

## 2016-10-18 NOTE — Patient Instructions (Signed)
I had a long d/w patient and his wife about his recent stroke, risk for recurrent stroke/TIAs, personally independently reviewed imaging studies and stroke evaluation results and answered questions.Continue Plavix  for secondary stroke prevention and maintain strict control of hypertension with blood pressure goal below 130/90,     and lipids with LDL cholesterol goal below 70 mg/dL. I also advised the patient to eat a healthy diet with plenty of whole grains, cereals, fruits and vegetables, exercise regularly and maintain ideal body weight. I advised him not to drive till his diplopia improves. He was advised to keep his upcoming appointment with ophthalmologist to discuss prisms for his diplopia. He was advised to wear an eye patch while reading and being outdoors. Followup in the future with my nurse practitioner in 6 months or call earlier if necessary Stroke Prevention Some medical conditions and behaviors are associated with an increased chance of having a stroke. You may prevent a stroke by making healthy choices and managing medical conditions. How can I reduce my risk of having a stroke?  Stay physically active. Get at least 30 minutes of activity on most or all days.  Do not smoke. It may also be helpful to avoid exposure to secondhand smoke.  Limit alcohol use. Moderate alcohol use is considered to be:  No more than 2 drinks per day for men.  No more than 1 drink per day for nonpregnant women.  Eat healthy foods. This involves:  Eating 5 or more servings of fruits and vegetables a day.  Making dietary changes that address high blood pressure (hypertension), high cholesterol, diabetes, or obesity.  Manage your cholesterol levels.  Making food choices that are high in fiber and low in saturated fat, trans fat, and cholesterol may control cholesterol levels.  Take any prescribed medicines to control cholesterol as directed by your health care provider.  Manage your  diabetes.  Controlling your carbohydrate and sugar intake is recommended to manage diabetes.  Take any prescribed medicines to control diabetes as directed by your health care provider.  Control your hypertension.  Making food choices that are low in salt (sodium), saturated fat, trans fat, and cholesterol is recommended to manage hypertension.  Ask your health care provider if you need treatment to lower your blood pressure. Take any prescribed medicines to control hypertension as directed by your health care provider.  If you are 95-64 years of age, have your blood pressure checked every 3-5 years. If you are 72 years of age or older, have your blood pressure checked every year.  Maintain a healthy weight.  Reducing calorie intake and making food choices that are low in sodium, saturated fat, trans fat, and cholesterol are recommended to manage weight.  Stop drug abuse.  Avoid taking birth control pills.  Talk to your health care provider about the risks of taking birth control pills if you are over 26 years old, smoke, get migraines, or have ever had a blood clot.  Get evaluated for sleep disorders (sleep apnea).  Talk to your health care provider about getting a sleep evaluation if you snore a lot or have excessive sleepiness.  Take medicines only as directed by your health care provider.  For some people, aspirin or blood thinners (anticoagulants) are helpful in reducing the risk of forming abnormal blood clots that can lead to stroke. If you have the irregular heart rhythm of atrial fibrillation, you should be on a blood thinner unless there is a good reason you cannot take them.  Understand all your medicine instructions.  Make sure that other conditions (such as anemia or atherosclerosis) are addressed. Get help right away if:  You have sudden weakness or numbness of the face, arm, or leg, especially on one side of the body.  Your face or eyelid droops to one  side.  You have sudden confusion.  You have trouble speaking (aphasia) or understanding.  You have sudden trouble seeing in one or both eyes.  You have sudden trouble walking.  You have dizziness.  You have a loss of balance or coordination.  You have a sudden, severe headache with no known cause.  You have new chest pain or an irregular heartbeat. Any of these symptoms may represent a serious problem that is an emergency. Do not wait to see if the symptoms will go away. Get medical help at once. Call your local emergency services (911 in [place] Do not drive yourself to the hospital.  This information is not intended to replace advice given to you by your health care provider. Make sure you discuss any questions you have with your health care provider. Document Released: 09/30/2004 Document Revised: 01/29/2016 Document Reviewed: 02/23/2013 Elsevier Interactive Patient Education  2017 [employer].

## 2016-10-18 NOTE — Telephone Encounter (Signed)
If patient or his wife call back, Please schedule him with Dr.Sethi in 6 months. Dr.Sethi left a vm on wifes cell phone. Please schedule pt with Dr.Sethi and not the NP. Thanks.

## 2016-10-26 ENCOUNTER — Ambulatory Visit (INDEPENDENT_AMBULATORY_CARE_PROVIDER_SITE_OTHER): Payer: Commercial Managed Care - PPO | Admitting: Family Medicine

## 2016-10-26 ENCOUNTER — Encounter: Payer: Self-pay | Admitting: Family Medicine

## 2016-10-26 DIAGNOSIS — E785 Hyperlipidemia, unspecified: Secondary | ICD-10-CM

## 2016-10-26 DIAGNOSIS — I10 Essential (primary) hypertension: Secondary | ICD-10-CM

## 2016-10-26 NOTE — Progress Notes (Signed)
Medical Nutrition Therapy:  Appt start time: 1130 end time:  1230.  Assessment:  Primary concerns today: Best diet to prevent stroke.  Omar Reed was accompanied by his wife [name] today.  He had a stoke about 6 weeks ago, and is very concerned with lowering his risk for another event.  He still has double vision from the stroke.  Has an appt at [facility] on 2/27 to get a Rx for corrective lenses, since his brain is not self-correcting.    History of HTN and hyperlipidemia.  [name] had been exercising regularly, and hopes to get back to regular exercise once he gets the corrective glasses.    Since his stroke, he has been more carefully reading labels, and limiting sodium intake.    Learning Readiness: Ready  Change in progress  Usual eating pattern includes 2-3 meals and 2-3 snacks per day. Frequent foods and beverages include water, hot tea w/ stevia, almond milk (3 X wk), bkfst of 2 mandarin  oranges, inst plain oatmeal, Ezekial bread, egg white omelette w/ veg's, fruit (esp grapes), salmon ~1 X wk, plain Grk yogurt.  Avoided foods include most cheese.   Usual physical activity includes TM 1-2 X wk for ~10 min.  Was going to gym ~4 X wk: steretching, ~40 min elliptical, 30 min resistance training, and 30 min pickle ball.   Usual food intake: (Last two days were atypical) (Up at 6-9 AM) Some kind of activity like walking for ~30 min.   B (11 AM)-   2 mandarin oranges, inst plain oatmeal, 1 slc Ezekial bread, 2 T hummus, 2 slc tomato, water Snk ( AM)-   Fruit  L (2 PM)-  1 eggwhite omelette, 1 slc Ezekial bread, 1 c almond milk, water [Out to lunch w/ friends 1 X wk; often to [employer] for salad - 270 mg Na] Snk (4:30)-  Fruit (~5 c grapes or banana or berries) D (7:30 PM)-  Salmon and veg's or salad with chx or homemade soup Snk ( PM)-  Occasionally 1/2 protein bar or small cookie or small portion of ice cream  Progress Towards Goal(s):  In progress.    Nutritional Diagnosis:  NB-1.1 Food and  nutrition-related knowledge deficit As related to cardiovascular protective diet.  As evidenced by patient's expressed interest in learning how to reduce his risk of a second stroke.    Intervention:  Nutrition education  Handouts given during visit include:  AVS  Demonstrated degree of understanding via:  Teach Back   Monitoring/Evaluation:  Dietary intake, exercise, and body weight prn.

## 2016-10-26 NOTE — Patient Instructions (Addendum)
-   When you use canned soup, add fresh or frozen veg's:  Microwave, then add soup and reheat.    - The DASH diet and the Mediteranean diet are both excellent diets.   - Bottom line:  Obtain twice as many veg's as protein or carbohydrate foods for both lunch and dinner.  - Limit sodium to 1500 mg per day.  For the next one to two weeks, track your sodium.    - The more veg's you eat, the more potassium you get, the better your blood pressure will be.    - Lowering LDL:  Limit saturated fat (animal products) and limit trans fat (any commercially fried food and many processed foods; look for hydrogenated oils).  - Cage-free / organic (free-range) eggs MAY have a more favorable fatty acid profile than regular grocery store eggs.     - Best fats (that can actually help lower LDL):  Olive oil (X-V is best); canola oil is also mon--unsaturated.  Avocados are also a good source of fat.    - Email me for the Brussels sprouts recipe, and with any other Qs.

## 2016-11-02 ENCOUNTER — Ambulatory Visit: Payer: Commercial Managed Care - PPO | Admitting: Neurology

## 2017-04-18 ENCOUNTER — Ambulatory Visit: Payer: Commercial Managed Care - PPO | Admitting: Neurology

## 2017-05-03 ENCOUNTER — Ambulatory Visit: Payer: Commercial Managed Care - PPO | Admitting: Neurology

## 2017-05-31 ENCOUNTER — Ambulatory Visit (INDEPENDENT_AMBULATORY_CARE_PROVIDER_SITE_OTHER): Payer: Commercial Managed Care - PPO | Admitting: Neurology

## 2017-05-31 ENCOUNTER — Encounter: Payer: Self-pay | Admitting: Neurology

## 2017-05-31 VITALS — BP 121/70 | HR 77 | Wt 153.4 lb

## 2017-05-31 DIAGNOSIS — R251 Tremor, unspecified: Secondary | ICD-10-CM

## 2017-05-31 NOTE — Patient Instructions (Signed)
I had a long d/w patient and his wife about his remote stroke, risk for recurrent stroke/TIAs, personally independently reviewed imaging studies and stroke evaluation results and answered questions.Continue Plavix  for secondary stroke prevention and maintain strict control of hypertension with blood pressure goal below 130/90, diabetes with hemoglobin A1c goal below 6.5% and lipids with LDL cholesterol goal below 70 mg/dL. I also advised the patient to eat a healthy diet with plenty of whole grains, cereals, fruits and vegetables, exercise regularly and maintain ideal body weight . Check thyroid function tests for his intermittent hand tremors and temperature intolerance. I have advised him to keep a strict blood pressure log and discuss medication changes with primary care physician at next visit if it is persistently low. Followup in the future with me in one year or call earlier if necessary

## 2017-05-31 NOTE — Progress Notes (Signed)
[facility] [address] [place]. [place] [address] [phone]       OFFICE FOLLOW-UP NOTE  [name] Date of Birth:  [birth year] Medical Record Number:  [id number]   OIN:[facility] visit 10/18/2016 ; [name] is a 66 year old male seen today for the first office follow-up visit following hospital admission for stroke in January 2017. He is accompanied by his wife. History is obtained from the patient, wife and review of hospital medical records. He presented on 09/12/2016 with double vision and gait imbalance. Symptoms were preceded by sensation of "zinging" electrical shock-like sensations bitemporally, which woke him up from sleep. Also described as a "flashing in head" sensation. When he got up, he noticed binocular double vision (relieved by closing either eye) and wobbly gait with decreased sense of balance. Onset of symptoms was about 6 AM on Sunday 09/12/2016. He endorsed frontal headache but denied neck pain, chest pain, abdominal pain or limb pain. He had no prior history of stroke or MI. He exercises regularly, tries to eat healthy and does not smoke. MRI  personally reviewedrevealeda 3 mm acute infarct involving the oculomotor nucleus on the right. No other significant ischemia was seen.Patient was not administered IV t-PA due to delay in arrival. He was admitted for further evaluation and treatment. MRA brain showed no large vessel intracranial stenosis. LDL cholesterol is elevated at 107 mg percent. Carotid Doppler showed no significant extracranial stenosis. Transthoracic echo showed normal ejection fraction without cardiac source of embolism. Hemoglobin A1c was 5.5. Patient had persistent diplopia particularly with the vertical gaze. He was advised to wear an eye patch. He states his diplopia still persist he has trouble mainly when he looks down or up close to his eyes. He does with by patch intermittently. He does have an upcoming appointment with the  ophthalmologist from [facility] on February 27 to discuss prisms. He is tolerating Plavix without bruising or bleeding. He states his blood pressure is well controlled and today it is 1-4/83. Is tolerating Lipitor well without muscle aches and pains. He has no other new complaints today. Update 05/31/2017 : He returns for follow-up after last visit 6 months ago. He is accompanied by his wife. He is doing well from stroke standpoint without recurrent TIA or stroke symptoms. The patient is starting Plavix without bruising or bleeding. States his blood pressure is well controlled and today it is 120/70. He complains of slight blurred vision first thing in the morning but it gets better as he is on his feet and walks around. 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 Patient himself minimizes this and is not concerned. Denies any bradykinesia, drooling of saliva or gait or balance difficulties. Patient is up taking Flonase and Atrovent inhalers but states he does not overuse them. ROS:   14 system review of systems is positive for blurred vision, tremor, heat intolerance, frequent waking, frequency of urination and all other systems negative    PMH:  Past Medical History:  Diagnosis Date  . Anxiety   . Hypertension   . Stroke [facility])     Social History:  Social History   Social History  . Marital status: Married    Spouse name: N/A  . Number of children: 2  . Years of education: N/A   Occupational History  . retired    Social History Main Topics  . Smoking status: Never Smoker  . Smokeless tobacco: Never Used  .  Alcohol use No  . Drug use: No  . Sexual activity: Not on file   Other Topics Concern  . Not on file   Social History Narrative  . No narrative on file    Medications:   Current Outpatient Prescriptions on File Prior to Visit  Medication Sig Dispense Refill  . irbesartan (AVAPRO) 150 MG tablet Take 150 mg by mouth daily after  lunch.    . Mouthwashes (BIOTENE DRY MOUTH MT) Use as directed 15 mLs in the mouth or throat at bedtime. Swish and spit    . Multiple Vitamin (MULTIVITAMIN WITH MINERALS) TABS tablet Take 1 tablet by mouth daily after lunch. Centrum Silver    . polyethylene glycol (MIRALAX / GLYCOLAX) packet Take 17 g by mouth daily as needed (constipation). Mix in 4-8 oz water and drink    . tamsulosin (FLOMAX) 0.4 MG CAPS capsule Take 0.4 mg by mouth daily after lunch.     No current facility-administered medications on file prior to visit.     Allergies:   Allergies  Allergen Reactions  . Griseofulvin Other (See Comments)    Severe headaches    Physical Exam General: well developed, well nourished middle-age Caucasian male, seated, in no evident distress Head: head normocephalic and atraumatic.  Neck: supple with no carotid or supraclavicular bruits Cardiovascular: regular rate and rhythm, no murmurs Musculoskeletal: no deformity Skin:  no rash/petichiae Vascular:  Normal pulses all extremities Vitals:   05/31/17 0927  BP: 121/70  Pulse: 77   Neurologic Exam Mental Status: Awake and fully alert. Oriented to place and time. Recent and remote memory intact. Attention span, concentration and fund of knowledge appropriate. Mood and affect appropriate.  Cranial Nerves: Fundoscopic exam not done. Pupils equal, briskly reactive to light. Extraocular movements show slight restriction of vertical gaze and subjective diplopia on downgaze and with accommodation but no nystagmus. Visual fields full to confrontation. Hearing intact. Facial sensation intact. Face, tongue, palate moves normally and symmetrically.  Motor: Normal bulk and tone. Normal strength in all tested extremity muscles. Sensory.: intact to touch ,pinprick .position and vibratory sensation.  Coordination: Rapid alternating movements normal in all extremities. Finger-to-nose and heel-to-shin performed accurately bilaterally. Gait and  Station: Arises from chair without difficulty. Stance is normal. Gait demonstrates normal stride length and balance . Able to heel, toe and tandem walk without difficulty.  Reflexes: 1+ and symmetric. Toes downgoing.   NIHSS  0 Modified Rankin  1  ASSESSMENT: 16 year Caucasian male with right ventral mid brain infarct secondary to small vessel disease in January 2017 with persistent diplopia. Vascular risk factors of hypertension, hyperlipidemia. Neuro complains of mild hand tremors and cold intolerance of unclear etiology. Will check thyroid function   PLAN: I had a long d/w patient and his wife about his remote stroke, risk for recurrent stroke/TIAs, personally independently reviewed imaging studies and stroke evaluation results and answered questions.Continue Plavix  for secondary stroke prevention and maintain strict control of hypertension with blood pressure goal below 130/90, diabetes with hemoglobin A1c goal below 6.5% and lipids with LDL cholesterol goal below 70 mg/dL. I also advised the patient to eat a healthy diet with plenty of whole grains, cereals, fruits and vegetables, exercise regularly and maintain ideal body weight . Check thyroid function tests for his intermittent hand tremors and temperature intolerance. I have advised him to keep a strict blood pressure log and discuss medication changes with primary care physician at next visit if it is persistently low. Followup in  the future with me in one year or call earlier if necessary Greater than 50% of time during this 25 minute visit was spent on counseling,explanation of diagnosis of lacunar stroke, , planning of further management,   and coordination of care Antony Contras, MD  [facility] [address] [place], [place] [address]  Phone [phone] Fax [phone] Note: This document was prepared with digital dictation and possible smart phrase technology. Any transcriptional errors that  result from this process are unintentional

## 2017-06-05 LAB — TSH+T3+FREE T4+T3 FREE
Free T-3: 3.1 pg/mL
Free T4 by Dialysis: 0.94 ng/dL
TRIIODOTHYRONINE (T-3), SERUM: 100 ng/dL
TSH-ICMA: 4.2 uU/mL

## 2017-06-09 ENCOUNTER — Encounter: Payer: Self-pay | Admitting: Neurology

## 2017-06-13 ENCOUNTER — Telehealth: Payer: Self-pay

## 2017-06-13 NOTE — Telephone Encounter (Signed)
-----   Message from Garvin Fila, MD sent at 06/11/2017 11:52 AM EDT ----- Mitchell Heir inform the patient that thyroid function tests were normal

## 2017-06-13 NOTE — Telephone Encounter (Signed)
I spoke with patient and made him aware of normal thyroid function tests and he voiced understanding.

## 2017-09-12 DIAGNOSIS — N3281 Overactive bladder: Secondary | ICD-10-CM | POA: Diagnosis not present

## 2017-09-12 DIAGNOSIS — R946 Abnormal results of thyroid function studies: Secondary | ICD-10-CM | POA: Diagnosis not present

## 2017-09-12 DIAGNOSIS — R0789 Other chest pain: Secondary | ICD-10-CM | POA: Diagnosis not present

## 2017-09-12 DIAGNOSIS — I10 Essential (primary) hypertension: Secondary | ICD-10-CM | POA: Diagnosis not present

## 2017-09-12 DIAGNOSIS — Z6826 Body mass index (BMI) 26.0-26.9, adult: Secondary | ICD-10-CM | POA: Diagnosis not present

## 2017-09-23 DIAGNOSIS — R3914 Feeling of incomplete bladder emptying: Secondary | ICD-10-CM | POA: Diagnosis not present

## 2017-09-23 DIAGNOSIS — R351 Nocturia: Secondary | ICD-10-CM | POA: Diagnosis not present

## 2017-09-23 DIAGNOSIS — R972 Elevated prostate specific antigen [PSA]: Secondary | ICD-10-CM | POA: Diagnosis not present

## 2017-09-23 DIAGNOSIS — N401 Enlarged prostate with lower urinary tract symptoms: Secondary | ICD-10-CM | POA: Diagnosis not present

## 2017-10-06 ENCOUNTER — Encounter: Payer: Self-pay | Admitting: Neurology

## 2017-10-06 ENCOUNTER — Ambulatory Visit (INDEPENDENT_AMBULATORY_CARE_PROVIDER_SITE_OTHER): Payer: Medicare Other | Admitting: Neurology

## 2017-10-06 VITALS — BP 143/95 | HR 75 | Wt 158.6 lb

## 2017-10-06 DIAGNOSIS — I6990 Unspecified sequelae of unspecified cerebrovascular disease: Secondary | ICD-10-CM | POA: Diagnosis not present

## 2017-10-06 NOTE — Progress Notes (Signed)
[facility] [address] [place]. [place] [address] [phone]       OFFICE FOLLOW-UP NOTE  Omar Reed Date of Birth:  1951/05/13 Medical Record Number:  [id number]   VQM:[id number] visit 10/18/2016 ; Omar Reed is a 67 year old male seen today for the first office follow-up visit following hospital admission for stroke in January 2018. He is accompanied by his wife. History is obtained from the patient, wife and review of hospital medical records. He presented on 09/12/2016 with double vision and gait imbalance. Symptoms were preceded by sensation of "zinging" electrical shock-like sensations bitemporally, which woke him up from sleep. Also described as a "flashing in head" sensation. When he got up, he noticed binocular double vision (relieved by closing either eye) and wobbly gait with decreased sense of balance. Onset of symptoms was about 6 AM on Sunday 09/12/2016. He endorsed frontal headache but denied neck pain, chest pain, abdominal pain or limb pain. He had no prior history of stroke or MI. He exercises regularly, tries to eat healthy and does not smoke. MRI  personally reviewedrevealeda 3 mm acute infarct involving the oculomotor nucleus on the right. No other significant ischemia was seen.Patient was not administered IV t-PA due to delay in arrival. He was admitted for further evaluation and treatment. MRA brain showed no large vessel intracranial stenosis. LDL cholesterol is elevated at 107 mg percent. Carotid Doppler showed no significant extracranial stenosis. Transthoracic echo showed normal ejection fraction without cardiac source of embolism. Hemoglobin A1c was 5.5. Patient had persistent diplopia particularly with the vertical gaze. He was advised to wear an eye patch. He states his diplopia still persist he has trouble mainly when he looks down or up close to his eyes. He does with by patch intermittently. He does have an upcoming appointment with the  ophthalmologist from [facility] on February 27 to discuss prisms. He is tolerating Plavix without bruising or bleeding. He states his blood pressure is well controlled and today it is 1-4/83. Is tolerating Lipitor well without muscle aches and pains. He has no other new complaints today. Update 05/31/2017 : He returns for follow-up after last visit 6 months ago. He is accompanied by his wife. He is doing well from stroke standpoint without recurrent TIA or stroke symptoms. The patient is starting Plavix without bruising or bleeding. States his blood pressure is well controlled and today it is 120/70. He complains of slight blurred vision first thing in the morning but it gets better as he is on his feet and walks around. 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 Patient himself minimizes this and is not concerned. Denies any bradykinesia, drooling of saliva or gait or balance difficulties. Patient is up taking Flonase and Atrovent inhalers but states he does not overuse them. Update 10/06/2017: he returns for follow-up after last visit 4 months ago.He states is doing well without recurrent stroke or TIA symptoms. He complains of mild positional dizziness when he gets up suddenly. This does not last long feels a sensation of imbalance rather than true vertigo or nausea. He states his double vision has improved but is still present to a mild degree. He has occasional blurred vision as well. He has tried wearing new glasses which has not helped. He remains on Plavix which is tolerating well without bleeding or bruising. States his blood pressure is under good control and today it is 143/95. He is tolerating Lipitor well without muscle  aches and pains. Patient is scheduled to undergo bladder surgery by Dr. Diona Fanti and is asking for neurological clearance.he states his hand tremors have improved and  are no longer   bothersome ROS:   14 system review of systems is  positive for blurred vision,excessive thirst, bladder urgency, dizziness, frequency of urination and all other systems negative    PMH:  Past Medical History:  Diagnosis Date  . Anxiety   . Hypertension   . Stroke [facility])     Social History:  Social History   Socioeconomic History  . Marital status: Married    Spouse name: Not on file  . Number of children: 2  . Years of education: Not on file  . Highest education level: Not on file  Social Needs  . Financial resource strain: Not on file  . Food insecurity - worry: Not on file  . Food insecurity - inability: Not on file  . Transportation needs - medical: Not on file  . Transportation needs - non-medical: Not on file  Occupational History  . Occupation: retired  Tobacco Use  . Smoking status: Never Smoker  . Smokeless tobacco: Never Used  Substance and Sexual Activity  . Alcohol use: No    Alcohol/week: 0.0 oz  . Drug use: No  . Sexual activity: Not on file  Other Topics Concern  . Not on file  Social History Narrative  . Not on file    Medications:   Current Outpatient Medications on File Prior to Visit  Medication Sig Dispense Refill  . ALPRAZolam (XANAX) 1 MG tablet     . amLODipine (NORVASC) 5 MG tablet     . atorvastatin (LIPITOR) 20 MG tablet Take by mouth.    . clopidogrel (PLAVIX) 75 MG tablet Take by mouth.    . fluticasone (FLONASE) 50 MCG/ACT nasal spray     . ipratropium (ATROVENT) 0.03 % nasal spray     . irbesartan (AVAPRO) 150 MG tablet Take 150 mg by mouth daily after lunch.    . Mouthwashes (BIOTENE DRY MOUTH MT) Use as directed 15 mLs in the mouth or throat at bedtime. Swish and spit    . polyethylene glycol (MIRALAX / GLYCOLAX) packet Take 17 g by mouth daily as needed (constipation). Mix in 4-8 oz water and drink    . ranitidine (ZANTAC) 150 MG tablet Take by mouth.    . Vitamins/Minerals TABS Take by mouth.     No current facility-administered medications on file prior to visit.      Allergies:   Allergies  Allergen Reactions  . Griseofulvin Other (See Comments)    Severe headaches    Physical Exam General: well developed, well nourished middle-age Caucasian male, seated, in no evident distress Head: head normocephalic and atraumatic.  Neck: supple with no carotid or supraclavicular bruits Cardiovascular: regular rate and rhythm, no murmurs Musculoskeletal: no deformity Skin:  no rash/petichiae Vascular:  Normal pulses all extremities Vitals:   10/06/17 0945  BP: (!) 143/95  Pulse: 75   Neurologic Exam Mental Status: Awake and fully alert. Oriented to place and time. Recent and remote memory intact. Attention span, concentration and fund of knowledge appropriate. Mood and affect appropriate.  Cranial Nerves: Fundoscopic exam not done. Pupils equal, briskly reactive to light. Extraocular movements show slight restriction of vertical gaze and subjective diplopia on downgaze and with accommodation but no nystagmus. Visual fields full to confrontation. Hearing intact. Facial sensation intact. Face, tongue, palate moves normally and symmetrically.  Motor: Normal  bulk and tone. Normal strength in all tested extremity muscles. Sensory.: intact to touch ,pinprick .position and vibratory sensation.  Coordination: Rapid alternating movements normal in all extremities. Finger-to-nose and heel-to-shin performed accurately bilaterally. Positive Fukuda stepping test with patient moving several steps. Hallpike maneuver not done Gait and Station: Arises from chair without difficulty. Stance is normal. Gait demonstrates normal stride length and balance . Able to heel, toe and tandem walk without difficulty.  Reflexes: 1+ and symmetric. Toes downgoing.      ASSESSMENT: 59 year Caucasian male with right ventral mid brain infarct secondary to small vessel disease in January 2017 with persistent diplopia. Vascular risk factors of hypertension, hyperlipidemia.new complains of  mild transient positional dizziness likely due to vestibular dysfunction versus orthostatic hypertension.    PLAN: I had a long d/w patient about his remote stroke,diplopia risk for recurrent stroke/TIAs, personally independently reviewed imaging studies and stroke evaluation results and answered questions.Continue clopidogrel 75 mg daily  for secondary stroke prevention and maintain strict control of hypertension with blood pressure goal below 130/90, diabetes with hemoglobin A1c goal below 6.5% and lipids with LDL cholesterol goal below 70 mg/dL. I also advised the patient to eat a healthy diet with plenty of whole grains, cereals, fruits and vegetables, exercise regularly and maintain ideal body weight. The patient has neurological clearance for his lateral surgery and may hold Plavix for 3-5 days prior to the procedure with a small but acceptable. periprocedural risk of TIA/stroke if patient is agreeable.I also advised him to get up slowly and to vestibular stabilization exercises for his positional dizziness. I also advised him not to take his Norvasc and Avapro at the same time but to split them to avoid orthostasis.Followup in the future with my nurse practitioner Janett Billow in 1 year or call earlier if necessary Greater than 50% of time during this 25 minute visit was spent on counseling,explanation of diagnosis of lacunar stroke, , planning of further management,   and coordination of care Antony Contras, MD  [facility] [address] [place] [place], [place] [address]  Phone [phone] Fax [phone] Note: This document was prepared with digital dictation and possible smart phrase technology. Any transcriptional errors that result from this process are unintentional

## 2017-10-06 NOTE — Patient Instructions (Signed)
I had a long d/w patient about his remote stroke,diplopia risk for recurrent stroke/TIAs, personally independently reviewed imaging studies and stroke evaluation results and answered questions.Continue clopidogrel 75 mg daily  for secondary stroke prevention and maintain strict control of hypertension with blood pressure goal below 130/90, diabetes with hemoglobin A1c goal below 6.5% and lipids with LDL cholesterol goal below 70 mg/dL. I also advised the patient to eat a healthy diet with plenty of whole grains, cereals, fruits and vegetables, exercise regularly and maintain ideal body weight. The patient has neurological clearance for his lateral surgery and may hold Plavix for 3-5 days prior to the procedure with a small but acceptable. periprocedural risk of TIA/stroke if patient is agreeable.I also advised him to get up slowly and to vestibular stabilization exercises for his positional dizziness. Followup in the future with my nurse practitioner Janett Billow in 1 year or call earlier if necessary

## 2017-10-31 IMAGING — MR MR HEAD W/O CM
9 of 10 series · 37 of 48 positions shown · non-contrast
Comparison: CT head 09/12/2016

CLINICAL DATA: Diplopia.

EXAM:
MRI HEAD WITHOUT CONTRAST
TECHNIQUE: Multiplanar, multiecho pulse sequences of the brain and surrounding
structures were obtained without intravenous contrast.

[Series 3: T1 · sagittal · 5.0mm · 0.47mm/px · 3 of 23 slices shown]
[im 1/23]
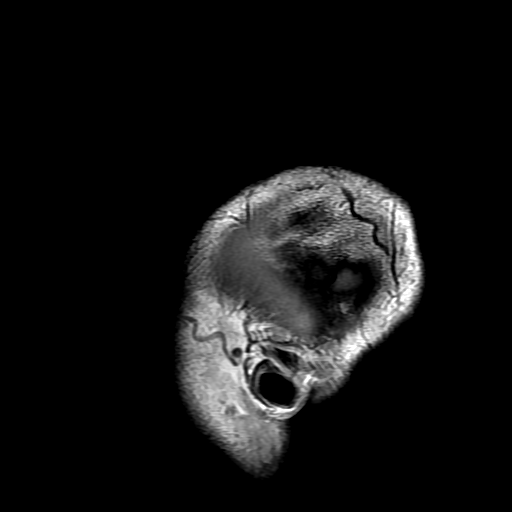
[im 12/23]
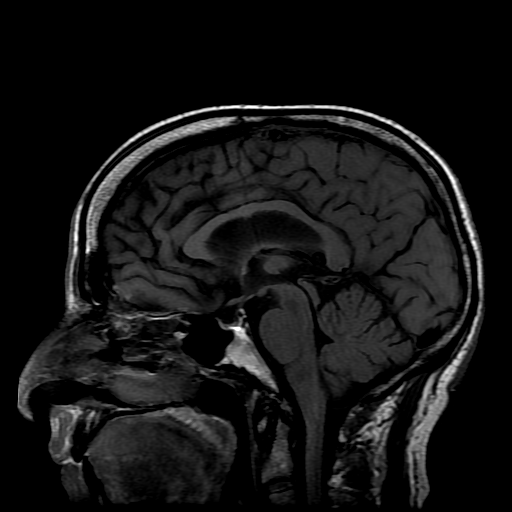
[im 23/23]
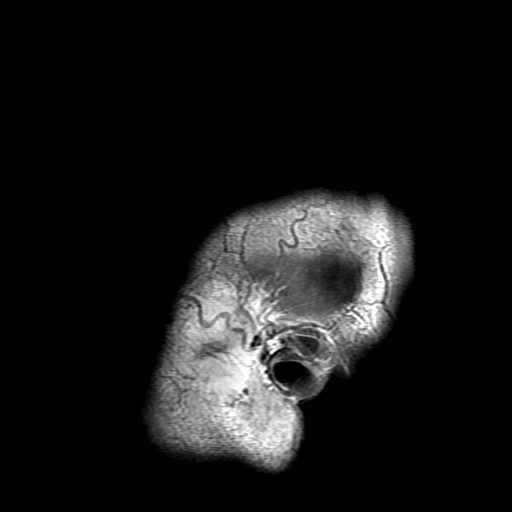

[Series 4: DWI · axial · 3.0mm · 1.09mm/px · z∈[-77,+63]mm · 9 of 96 slices shown (1 of 4)]
[im 1/96]
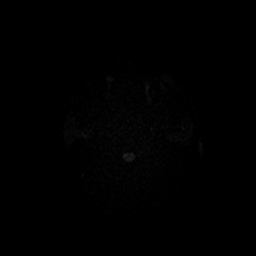
[im 11/96]
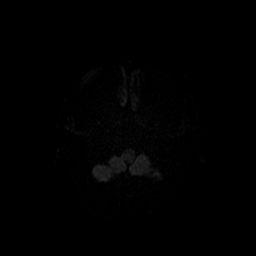
[im 22/96]
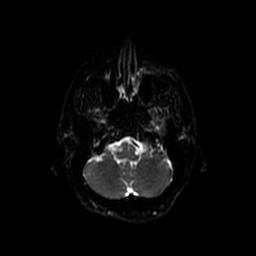
[im 32/96]
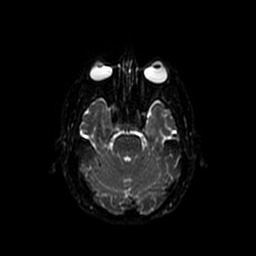
[im 43/96]
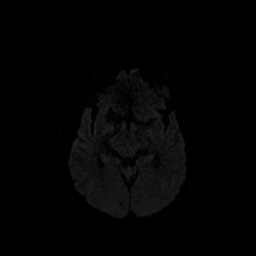
[im 53/96]
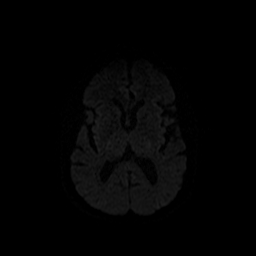
[im 64/96]
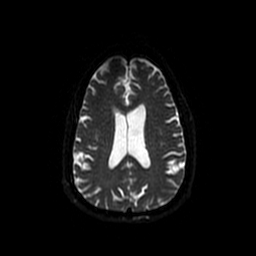
[im 85/96]
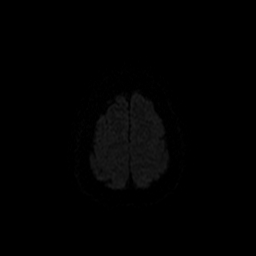
[im 96/96]
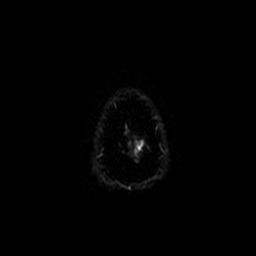

[Series 5: DWI · coronal · 5.0mm · 1.09mm/px · 7 of 72 slices shown (2 of 4)]
[im 1/72]
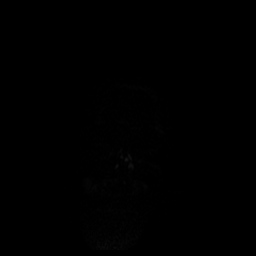
[im 12/72]
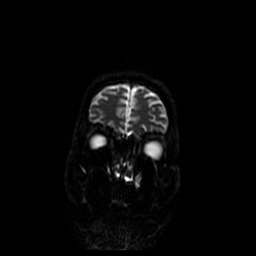
[im 24/72]
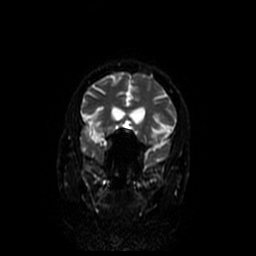
[im 36/72]
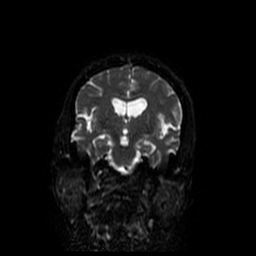
[im 48/72]
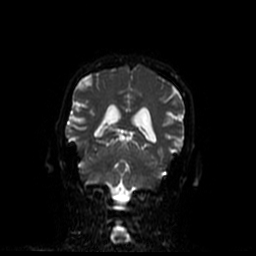
[im 60/72]
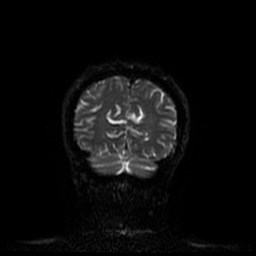
[im 72/72]
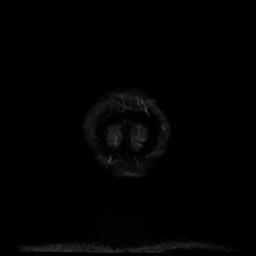

[Series 6: T2 · axial · 5.0mm · 0.49mm/px · z∈[-75,+62]mm · 2 of 24 slices shown (1 of 2)]
[im 1/24]
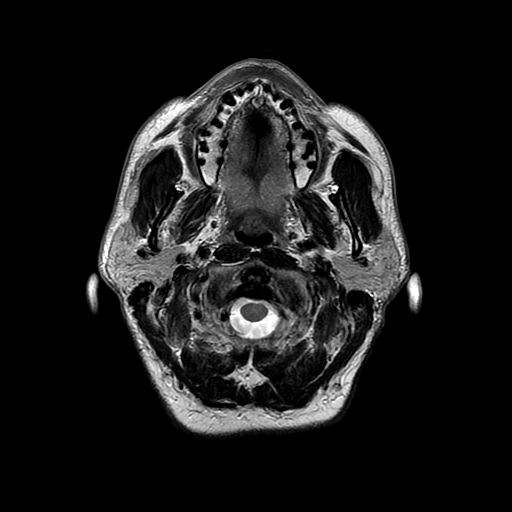
[im 24/24]
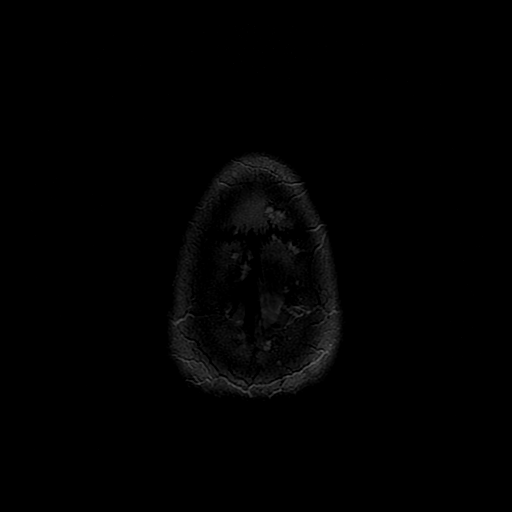

[Series 7: FLAIR · axial · 5.0mm · 0.49mm/px · z∈[-74,+63]mm · 2 of 24 slices shown]
[im 1/24]
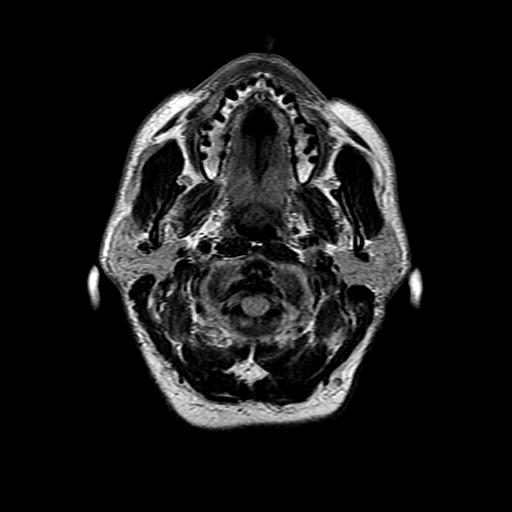
[im 24/24]
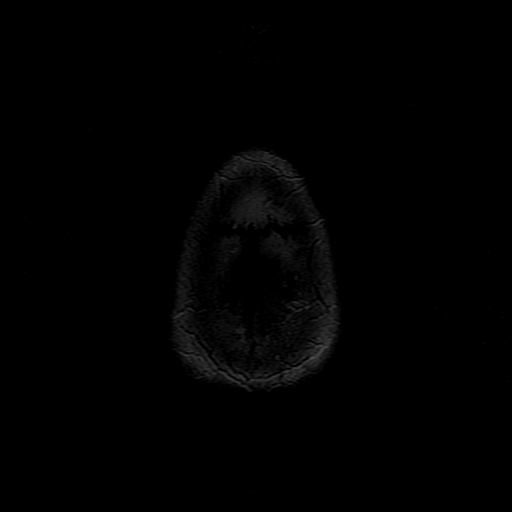

[Series 8: ax mpgr · axial · 5.0mm · 0.49mm/px · z∈[-76,+61]mm · 2 of 24 slices shown]
[im 1/24]
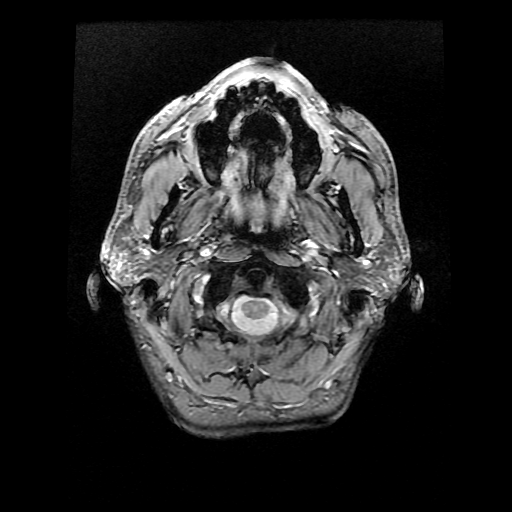
[im 24/24]
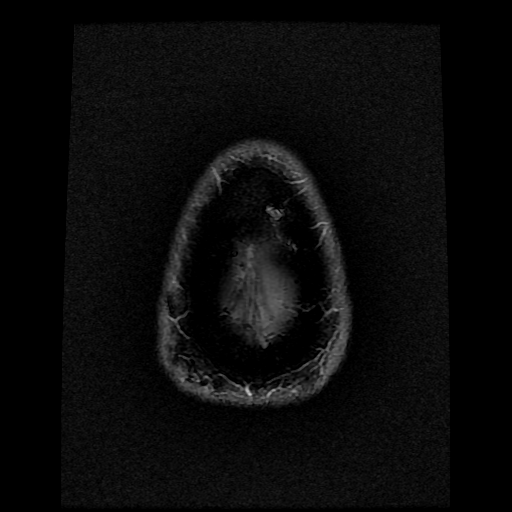

[Series 10: T2 · coronal · 5.0mm · 0.43mm/px · 3 of 30 slices shown (2 of 2)]
[im 1/30]
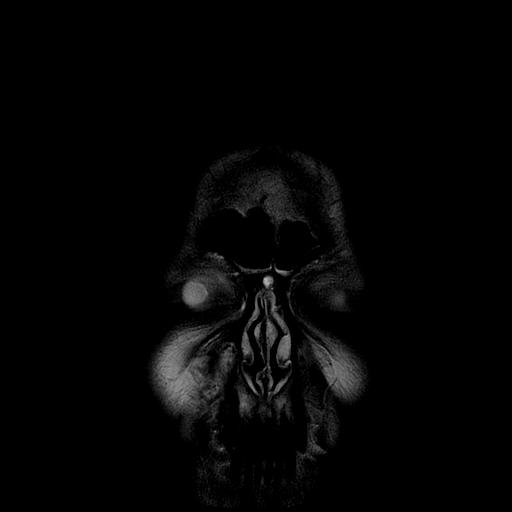
[im 15/30]
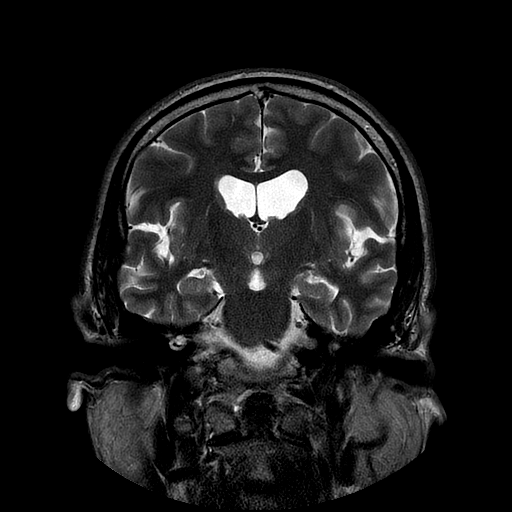
[im 30/30]
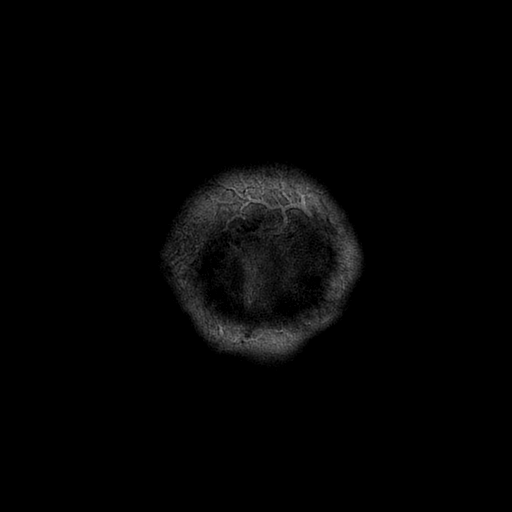

[Series 400: DWI · axial · 3.0mm · 1.09mm/px · z∈[-77,+63]mm · 5 of 48 slices shown (3 of 4)]
[im 1/48]
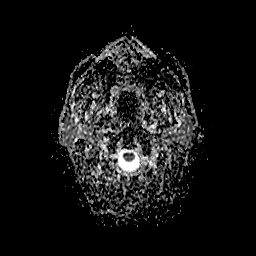
[im 12/48]
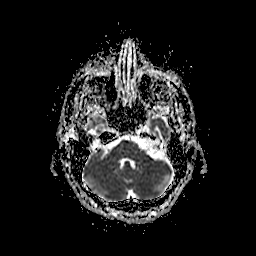
[im 24/48]
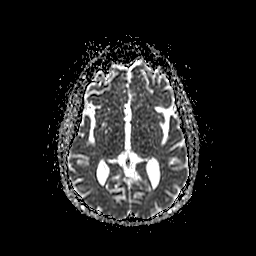
[im 36/48]
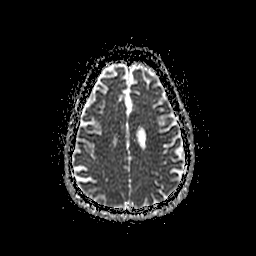
[im 48/48]
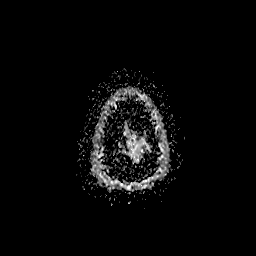

[Series 500: DWI · coronal · 5.0mm · 1.09mm/px · 4 of 36 slices shown (4 of 4)]
[im 1/36]
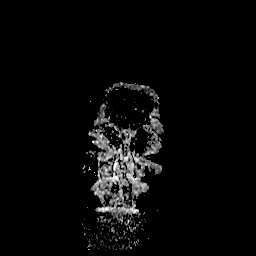
[im 12/36]
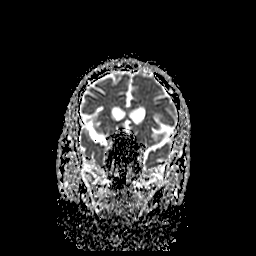
[im 24/36]
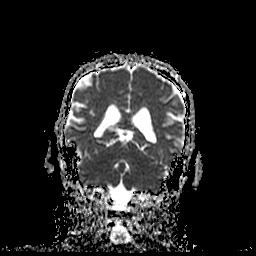
[im 36/36]
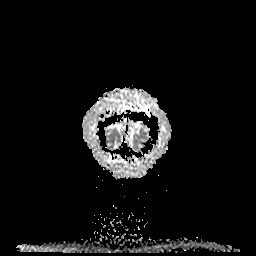

[37 of 48 positions shown; findings below may reference images not displayed]

FINDINGS: Brain: 3 mm acute infarct in the right midbrain, immediately
anterior and to the right of the cerebral aqueduct. This involves
the oculomotor nucleus accounting for diplopia. No other acute
infarct. No significant chronic ischemia.

Negative for hemorrhage or mass. Incidental small arachnoid cyst in
the right cerebellar pontine angle cistern. Pituitary normal in
size.

Vascular: Normal arterial flow voids.

Skull and upper cervical spine: Negative

Sinuses/Orbits: Mucosal edema paranasal sinuses.  Normal orbit.

Other: None
IMPRESSION: 3 mm acute infarct involving the oculomotor nucleus on the right. No
other significant ischemia.

## 2017-11-21 DIAGNOSIS — N401 Enlarged prostate with lower urinary tract symptoms: Secondary | ICD-10-CM | POA: Diagnosis not present

## 2017-11-21 DIAGNOSIS — R3914 Feeling of incomplete bladder emptying: Secondary | ICD-10-CM | POA: Diagnosis not present

## 2017-11-28 DIAGNOSIS — N401 Enlarged prostate with lower urinary tract symptoms: Secondary | ICD-10-CM | POA: Diagnosis not present

## 2017-11-28 DIAGNOSIS — R3914 Feeling of incomplete bladder emptying: Secondary | ICD-10-CM | POA: Diagnosis not present

## 2017-12-19 DIAGNOSIS — R350 Frequency of micturition: Secondary | ICD-10-CM | POA: Diagnosis not present

## 2017-12-19 DIAGNOSIS — N401 Enlarged prostate with lower urinary tract symptoms: Secondary | ICD-10-CM | POA: Diagnosis not present

## 2017-12-19 DIAGNOSIS — R3915 Urgency of urination: Secondary | ICD-10-CM | POA: Diagnosis not present

## 2018-01-09 DIAGNOSIS — N401 Enlarged prostate with lower urinary tract symptoms: Secondary | ICD-10-CM | POA: Diagnosis not present

## 2018-01-09 DIAGNOSIS — R351 Nocturia: Secondary | ICD-10-CM | POA: Diagnosis not present

## 2018-01-31 DIAGNOSIS — H43812 Vitreous degeneration, left eye: Secondary | ICD-10-CM | POA: Diagnosis not present

## 2018-01-31 DIAGNOSIS — I6312 Cerebral infarction due to embolism of basilar artery: Secondary | ICD-10-CM | POA: Diagnosis not present

## 2018-01-31 DIAGNOSIS — H2513 Age-related nuclear cataract, bilateral: Secondary | ICD-10-CM | POA: Diagnosis not present

## 2018-03-21 ENCOUNTER — Encounter: Payer: Self-pay | Admitting: Family Medicine

## 2018-03-21 ENCOUNTER — Ambulatory Visit (INDEPENDENT_AMBULATORY_CARE_PROVIDER_SITE_OTHER): Payer: Medicare Other | Admitting: Family Medicine

## 2018-03-21 DIAGNOSIS — M25521 Pain in right elbow: Secondary | ICD-10-CM | POA: Diagnosis not present

## 2018-03-21 MED ORDER — DICLOFENAC SODIUM 1 % TD GEL: 2.0000 g | Freq: Four times a day (QID) | TRANSDERMAL | 2 refills | Status: DC

## 2018-03-21 MED ORDER — NITROGLYCERIN 0.2 MG/HR TD PT24: MEDICATED_PATCH | TRANSDERMAL | 1 refills | Status: DC

## 2018-03-21 NOTE — Progress Notes (Signed)
PCP: Prince Solian, MD  Subjective:   HPI: Patient is a 67 y.o. male here for right elbow pain.  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 He states about 5 weeks ago he hit a shot and felt something in lateral aspect of his right elbow. He continued playing through this but pain worsened so stopped and rested from this for 2-3 weeks. He focused on icing, rest, wearing counterforce brace. No prior problems. Pain is 3/10 but up to 7/10 and sharp at times. Soreness goes into forearm. No numbness, skin changes.  Past Medical History:  Diagnosis Date  . Anxiety   . Hypertension   . Stroke [facility])     Current Outpatient Medications on File Prior to Visit  Medication Sig Dispense Refill  . amLODipine (NORVASC) 5 MG tablet     . atorvastatin (LIPITOR) 20 MG tablet Take by mouth.    . clopidogrel (PLAVIX) 75 MG tablet Take by mouth.    . fluticasone (FLONASE) 50 MCG/ACT nasal spray     . ipratropium (ATROVENT) 0.03 % nasal spray     . irbesartan (AVAPRO) 150 MG tablet Take 150 mg by mouth daily after lunch.    . ranitidine (ZANTAC) 150 MG tablet Take by mouth.    . Vitamins/Minerals TABS Take by mouth.     No current facility-administered medications on file prior to visit.     Past Surgical History:  Procedure Laterality Date  . APPENDECTOMY    . NASAL SINUS SURGERY      Allergies  Allergen Reactions  . Griseofulvin Other (See Comments)    Severe headaches    Social History   Socioeconomic History  . Marital status: Married    Spouse name: Not on file  . Number of children: 2  . Years of education: Not on file  . Highest education level: Not on file  Occupational History  . Occupation: retired  Scientific laboratory technician  . Financial resource strain: Not on file  . Food insecurity:    Worry: Not on file    Inability: Not on file  . Transportation needs:    Medical: Not on file    Non-medical: Not on file  Tobacco Use  . Smoking status: Never Smoker  .  Smokeless tobacco: Never Used  Substance and Sexual Activity  . Alcohol use: No    Alcohol/week: 0.0 oz  . Drug use: No  . Sexual activity: Not on file  Lifestyle  . Physical activity:    Days per week: Not on file    Minutes per session: Not on file  . Stress: Not on file  Relationships  . Social connections:    Talks on phone: Not on file    Gets together: Not on file    Attends religious service: Not on file    Active member of club or organization: Not on file    Attends meetings of clubs or organizations: Not on file    Relationship status: Not on file  . Intimate partner violence:    Fear of current or ex partner: Not on file    Emotionally abused: Not on file    Physically abused: Not on file    Forced sexual activity: Not on file  Other Topics Concern  . Not on file  Social History Narrative  . Not on file    Family History  Problem Relation Age of Onset  . CAD Mother     BP (!) 153/99   Pulse 75  Ht 5\' 5"  (1.651 m)   Wt 153 lb (69.4 kg)   BMI 25.46 kg/m   Review of Systems: See HPI above.     Objective:  Physical Exam:  Gen: NAD, comfortable in exam room  Right elbow: No deformity, swelling, bruising. FROM with 5/5 strength flexion and extension.  Pain on resisted wrist extension and supination with 5/5 strength.  No pain 3rd digit extension. TTP lateral epicondyle.  No other tenderness. Collateral ligaments intact. NVI distally.  Left elbow: No deformity. FROM with 5/5 strength. No tenderness to palpation. NVI distally.   MSK u/s right elbow:  Small insertional tear of common extensor tendon about 30% in length.  No other abnormalities.  Assessment & Plan:  1. Right elbow pain - 2/2 common extensor tendon partial tear/strain with lateral epicondylitis.  Icing, voltaren gel, start nitro patches.  Shown home exercises to start daily.  Wrist brace and counterforce brace.  Consider PT.  F/u in 6 weeks.

## 2018-03-21 NOTE — Assessment & Plan Note (Signed)
2/2 common extensor tendon partial tear/strain with lateral epicondylitis.  Icing, voltaren gel, start nitro patches.  Shown home exercises to start daily.  Wrist brace and counterforce brace.  Consider PT.  F/u in 6 weeks.

## 2018-03-21 NOTE — Patient Instructions (Signed)
You strained your common extensor tendon and have lateral epicondylitis Try to avoid painful activities when possible. Ice the area 3-4 times a day for 15 minutes at a time. Voltaren gel up to 4 times a day topically for inflammation. Nitro patches 1/4th patch to affected area, change daily. Counterforce brace as directed can help unload area - wear this regularly if it provides you with relief. Wrist brace to help rest this except when doing your exercises. Hammer rotation exercise, wrist extension exercise with 1 pound weight - 3 sets of 10 once a day. Stretching - hold for 20-30 seconds and repeat 3 times. Follow up in 6 weeks.

## 2018-03-27 ENCOUNTER — Encounter: Payer: Self-pay | Admitting: Family Medicine

## 2018-04-04 DIAGNOSIS — Z125 Encounter for screening for malignant neoplasm of prostate: Secondary | ICD-10-CM | POA: Diagnosis not present

## 2018-04-04 DIAGNOSIS — R82998 Other abnormal findings in urine: Secondary | ICD-10-CM | POA: Diagnosis not present

## 2018-04-04 DIAGNOSIS — R946 Abnormal results of thyroid function studies: Secondary | ICD-10-CM | POA: Diagnosis not present

## 2018-04-04 DIAGNOSIS — E7849 Other hyperlipidemia: Secondary | ICD-10-CM | POA: Diagnosis not present

## 2018-04-04 DIAGNOSIS — I10 Essential (primary) hypertension: Secondary | ICD-10-CM | POA: Diagnosis not present

## 2018-04-11 DIAGNOSIS — K219 Gastro-esophageal reflux disease without esophagitis: Secondary | ICD-10-CM | POA: Diagnosis not present

## 2018-04-11 DIAGNOSIS — I639 Cerebral infarction, unspecified: Secondary | ICD-10-CM | POA: Diagnosis not present

## 2018-04-11 DIAGNOSIS — Z23 Encounter for immunization: Secondary | ICD-10-CM | POA: Diagnosis not present

## 2018-04-11 DIAGNOSIS — I519 Heart disease, unspecified: Secondary | ICD-10-CM | POA: Diagnosis not present

## 2018-04-11 DIAGNOSIS — R946 Abnormal results of thyroid function studies: Secondary | ICD-10-CM | POA: Diagnosis not present

## 2018-04-11 DIAGNOSIS — G4733 Obstructive sleep apnea (adult) (pediatric): Secondary | ICD-10-CM | POA: Diagnosis not present

## 2018-04-11 DIAGNOSIS — E7849 Other hyperlipidemia: Secondary | ICD-10-CM | POA: Diagnosis not present

## 2018-04-11 DIAGNOSIS — Z0000 Encounter for general adult medical examination without abnormal findings: Secondary | ICD-10-CM | POA: Diagnosis not present

## 2018-04-11 DIAGNOSIS — Z6826 Body mass index (BMI) 26.0-26.9, adult: Secondary | ICD-10-CM | POA: Diagnosis not present

## 2018-04-11 DIAGNOSIS — M779 Enthesopathy, unspecified: Secondary | ICD-10-CM | POA: Diagnosis not present

## 2018-04-11 DIAGNOSIS — F418 Other specified anxiety disorders: Secondary | ICD-10-CM | POA: Diagnosis not present

## 2018-04-11 DIAGNOSIS — I10 Essential (primary) hypertension: Secondary | ICD-10-CM | POA: Diagnosis not present

## 2018-04-11 DIAGNOSIS — Z1389 Encounter for screening for other disorder: Secondary | ICD-10-CM | POA: Diagnosis not present

## 2018-05-02 DIAGNOSIS — H2513 Age-related nuclear cataract, bilateral: Secondary | ICD-10-CM | POA: Diagnosis not present

## 2018-05-02 DIAGNOSIS — H43812 Vitreous degeneration, left eye: Secondary | ICD-10-CM | POA: Diagnosis not present

## 2018-05-02 DIAGNOSIS — I6312 Cerebral infarction due to embolism of basilar artery: Secondary | ICD-10-CM | POA: Diagnosis not present

## 2018-05-04 ENCOUNTER — Ambulatory Visit: Payer: Medicare Other | Admitting: Family Medicine

## 2018-05-31 ENCOUNTER — Ambulatory Visit: Payer: Commercial Managed Care - PPO | Admitting: Neurology

## 2018-06-13 DIAGNOSIS — Z23 Encounter for immunization: Secondary | ICD-10-CM | POA: Diagnosis not present

## 2018-07-12 DIAGNOSIS — N400 Enlarged prostate without lower urinary tract symptoms: Secondary | ICD-10-CM | POA: Diagnosis not present

## 2018-07-26 DIAGNOSIS — L821 Other seborrheic keratosis: Secondary | ICD-10-CM | POA: Diagnosis not present

## 2018-07-26 DIAGNOSIS — D2271 Melanocytic nevi of right lower limb, including hip: Secondary | ICD-10-CM | POA: Diagnosis not present

## 2018-07-26 DIAGNOSIS — L84 Corns and callosities: Secondary | ICD-10-CM | POA: Diagnosis not present

## 2018-07-26 DIAGNOSIS — D225 Melanocytic nevi of trunk: Secondary | ICD-10-CM | POA: Diagnosis not present

## 2018-07-26 DIAGNOSIS — D2272 Melanocytic nevi of left lower limb, including hip: Secondary | ICD-10-CM | POA: Diagnosis not present

## 2018-07-26 DIAGNOSIS — D1801 Hemangioma of skin and subcutaneous tissue: Secondary | ICD-10-CM | POA: Diagnosis not present

## 2018-07-26 DIAGNOSIS — L570 Actinic keratosis: Secondary | ICD-10-CM | POA: Diagnosis not present

## 2018-07-31 DIAGNOSIS — R1032 Left lower quadrant pain: Secondary | ICD-10-CM | POA: Diagnosis not present

## 2018-07-31 DIAGNOSIS — Z6826 Body mass index (BMI) 26.0-26.9, adult: Secondary | ICD-10-CM | POA: Diagnosis not present

## 2018-08-31 DIAGNOSIS — N529 Male erectile dysfunction, unspecified: Secondary | ICD-10-CM | POA: Diagnosis not present

## 2018-09-28 DIAGNOSIS — N529 Male erectile dysfunction, unspecified: Secondary | ICD-10-CM | POA: Diagnosis not present

## 2018-09-28 DIAGNOSIS — R351 Nocturia: Secondary | ICD-10-CM | POA: Diagnosis not present

## 2018-09-28 DIAGNOSIS — N401 Enlarged prostate with lower urinary tract symptoms: Secondary | ICD-10-CM | POA: Diagnosis not present

## 2018-10-03 DIAGNOSIS — E7849 Other hyperlipidemia: Secondary | ICD-10-CM | POA: Diagnosis not present

## 2018-10-09 ENCOUNTER — Encounter: Payer: Self-pay | Admitting: Adult Health

## 2018-10-09 ENCOUNTER — Ambulatory Visit (INDEPENDENT_AMBULATORY_CARE_PROVIDER_SITE_OTHER): Payer: Medicare Other | Admitting: Adult Health

## 2018-10-09 VITALS — BP 134/85 | HR 70 | Ht 65.0 in | Wt 154.6 lb

## 2018-10-09 DIAGNOSIS — E785 Hyperlipidemia, unspecified: Secondary | ICD-10-CM | POA: Diagnosis not present

## 2018-10-09 DIAGNOSIS — H5500 Unspecified nystagmus: Secondary | ICD-10-CM

## 2018-10-09 DIAGNOSIS — I10 Essential (primary) hypertension: Secondary | ICD-10-CM | POA: Diagnosis not present

## 2018-10-09 DIAGNOSIS — R278 Other lack of coordination: Secondary | ICD-10-CM

## 2018-10-09 DIAGNOSIS — I6990 Unspecified sequelae of unspecified cerebrovascular disease: Secondary | ICD-10-CM | POA: Diagnosis not present

## 2018-10-09 DIAGNOSIS — R2689 Other abnormalities of gait and mobility: Secondary | ICD-10-CM

## 2018-10-09 NOTE — Progress Notes (Signed)
[facility] [address] [place]. [place] [address] [phone]       OFFICE FOLLOW-UP NOTE  [name] Date of Birth:  [birth year] Medical Record Number:  [id number]   HPI: 10/09/18 [name] is being seen today for follow-up visit with post stroke deficits of diplopia and dizziness.  He continues on Plavix without side effects of bleeding or bruising.  Continues on atorvastatin without side effects myalgias.  Blood pressure today satisfactory 134/85 which is normal for patient as he does monitor at home. He has recently had a 8lb weight loss. He does have physical tomorrow with PCP and recent lab work completed last week. He does continue to stay active playing pickleball and racquetball.  He will occasionally have difficulty with coordination/balance such as when he also hit the ball or if he is going down steps.  He denies any visual difficulties such as double or blurred vision but does endorse increased difficulty with seeing at night. Denies new or worsening stroke/TIA symptoms.  Summary history: Initial visit 10/18/2016 ; [name] is a 68 year old male seen today for the first office follow-up visit following hospital admission for stroke in January 2018. He is accompanied by his wife. History is obtained from the patient, wife and review of hospital medical records. He presented on 09/12/2016 with double vision and gait imbalance. Symptoms were preceded by sensation of "zinging" electrical shock-like sensations bitemporally, which woke him up from sleep. Also described as a "flashing in head" sensation. When he got up, he noticed binocular double vision (relieved by closing either eye) and wobbly gait with decreased sense of balance. Onset of symptoms was about 6 AM on Sunday 09/12/2016. He endorsed frontal headache but denied neck pain, chest pain, abdominal pain or limb pain. He had no prior history of stroke or MI. He exercises regularly, tries to eat healthy and  does not smoke. MRI  personally reviewedrevealeda 3 mm acute infarct involving the oculomotor nucleus on the right. No other significant ischemia was seen.Patient was not administered IV t-PA due to delay in arrival. He was admitted for further evaluation and treatment. MRA brain showed no large vessel intracranial stenosis. LDL cholesterol is elevated at 107 mg percent. Carotid Doppler showed no significant extracranial stenosis. Transthoracic echo showed normal ejection fraction without cardiac source of embolism. Hemoglobin A1c was 5.5. Patient had persistent diplopia particularly with the vertical gaze. He was advised to wear an eye patch. He states his diplopia still persist he has trouble mainly when he looks down or up close to his eyes. He does with by patch intermittently. He does have an upcoming appointment with the ophthalmologist from [facility] on February 27 to discuss prisms. He is tolerating Plavix without bruising or bleeding. He states his blood pressure is well controlled and today it is 1-4/83. Is tolerating Lipitor well without muscle aches and pains. He has no other new complaints today. Update 05/31/2017 : He returns for follow-up after last visit 6 months ago. He is accompanied by his wife. He is doing well from stroke standpoint without recurrent TIA or stroke symptoms. The patient is starting Plavix without bruising or bleeding. States his blood pressure is well controlled and today it is 120/70. He complains of slight blurred vision first thing in the morning but it gets better as he is on his feet and walks around. 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 Patient himself minimizes this and is  not concerned. Denies any bradykinesia, drooling of saliva or gait or balance difficulties. Patient is up taking Flonase and Atrovent inhalers but states he does not overuse them. Update 10/06/2017: he returns for follow-up after last visit 4  months ago.He states is doing well without recurrent stroke or TIA symptoms. He complains of mild positional dizziness when he gets up suddenly. This does not last long feels a sensation of imbalance rather than true vertigo or nausea. He states his double vision has improved but is still present to a mild degree. He has occasional blurred vision as well. He has tried wearing new glasses which has not helped. He remains on Plavix which is tolerating well without bleeding or bruising. States his blood pressure is under good control and today it is 143/95. He is tolerating Lipitor well without muscle aches and pains. Patient is scheduled to undergo bladder surgery by Dr. Diona Fanti and is asking for neurological clearance.he states his hand tremors have improved and  are no longer   Bothersome  ROS:   14 system review of systems is positive for see HPI and all other systems negative    PMH:  Past Medical History:  Diagnosis Date  . Anxiety   . Hypertension   . Stroke [facility])     Social History:  Social History   Socioeconomic History  . Marital status: Married    Spouse name: Not on file  . Number of children: 2  . Years of education: Not on file  . Highest education level: Not on file  Occupational History  . Occupation: retired  Scientific laboratory technician  . Financial resource strain: Not on file  . Food insecurity:    Worry: Not on file    Inability: Not on file  . Transportation needs:    Medical: Not on file    Non-medical: Not on file  Tobacco Use  . Smoking status: Never Smoker  . Smokeless tobacco: Never Used  Substance and Sexual Activity  . Alcohol use: No    Alcohol/week: 0.0 standard drinks  . Drug use: No  . Sexual activity: Not on file  Lifestyle  . Physical activity:    Days per week: Not on file    Minutes per session: Not on file  . Stress: Not on file  Relationships  . Social connections:    Talks on phone: Not on file    Gets together: Not on file    Attends religious  service: Not on file    Active member of club or organization: Not on file    Attends meetings of clubs or organizations: Not on file    Relationship status: Not on file  . Intimate partner violence:    Fear of current or ex partner: Not on file    Emotionally abused: Not on file    Physically abused: Not on file    Forced sexual activity: Not on file  Other Topics Concern  . Not on file  Social History Narrative  . Not on file    Medications:   Current Outpatient Medications on File Prior to Visit  Medication Sig Dispense Refill  . amLODipine (NORVASC) 5 MG tablet     . atorvastatin (LIPITOR) 20 MG tablet Take by mouth.    . clopidogrel (PLAVIX) 75 MG tablet Take by mouth.    . fluticasone (FLONASE) 50 MCG/ACT nasal spray     . ipratropium (ATROVENT) 0.03 % nasal spray     . irbesartan (AVAPRO) 150 MG tablet  Take 150 mg by mouth daily after lunch.    . ranitidine (ZANTAC) 150 MG tablet Take by mouth.    . Vitamins/Minerals TABS Take by mouth.    . nitroGLYCERIN (NITRODUR - DOSED IN MG/24 HR) 0.2 mg/hr patch Apply 1/4th patch to affected elbow, change daily (Patient not taking: Reported on 10/09/2018) 30 patch 1   No current facility-administered medications on file prior to visit.     Allergies:   Allergies  Allergen Reactions  . Griseofulvin Other (See Comments)    Severe headaches    Physical Exam General: well developed, well nourished middle-age Caucasian male, seated, in no evident distress Head: head normocephalic and atraumatic.  Neck: supple with no carotid or supraclavicular bruits Cardiovascular: regular rate and rhythm, no murmurs Musculoskeletal: no deformity Skin:  no rash/petichiae Vascular:  Normal pulses all extremities Vitals:   10/09/18 1405  BP: 134/85  Pulse: 70   Neurologic Exam Mental Status: Awake and fully alert. Oriented to place and time. Recent and remote memory intact. Attention span, concentration and fund of knowledge appropriate. Mood  and affect appropriate.  Cranial Nerves:  Pupils equal, briskly reactive to light. Extraocular movements show nystagmus with horizontal movements. Visual fields full to confrontation. Hearing intact. Facial sensation intact. Face, tongue, palate moves normally and symmetrically.  Motor: Normal bulk and tone. Normal strength in all tested extremity muscles. Sensory.: intact to touch ,pinprick .position and vibratory sensation.  Coordination: Rapid alternating movements normal in all extremities. Finger-to-nose and heel-to-shin performed accurately bilaterally Gait and Station: Arises from chair without difficulty. Stance is normal. Gait demonstrates normal stride length and balance . Able to heel, toe and tandem walk without difficulty.  Reflexes: 1+ and symmetric. Toes downgoing.      ASSESSMENT: 21 year Caucasian male with right ventral mid brain infarct secondary to small vessel disease in January 2017 with persistent diplopia. Vascular risk factors of hypertension, hyperlipidemia.  He returns today for follow-up visit with continued balance and coordination deficits likely related to vestibular dysfunction.  PLAN: -Continue clopidogrel 75 mg and atorvastatin for secondary stroke prevention -Continue to follow with PCP for HTN HLD management -Vestibular dysfunction including difficulties with coordination, balance and nystagmus post stroke -referred to PT/OT for vestibular rehab to assist patient with compensation strategies -Advised to continue to stay active and maintain a healthy diet -Continue to monitor blood pressure at home Maintain strict control of hypertension with blood pressure goal below 130/90, diabetes with hemoglobin A1c goal below 6.5% and cholesterol with LDL cholesterol (bad cholesterol) goal below 70 mg/dL. I also advised the patient to eat a healthy diet with plenty of whole grains, cereals, fruits and vegetables, exercise regularly and maintain ideal body weight.  As he  is stable from a stroke standpoint, is recommended for him to follow-up as needed  Greater than 50% of time during this 25 minute visit was spent on counseling,explanation of diagnosis of lacunar stroke, , planning of further management,   and coordination of care  Venancio Poisson, AGNP-BC  [facility] [address] [place], [place] [address]  Phone [phone] Fax [phone] Note: This document was prepared with digital dictation and possible smart phrase technology. Any transcriptional errors that result from this process are unintentional.

## 2018-10-09 NOTE — Progress Notes (Signed)
I agree with the above plan 

## 2018-10-09 NOTE — Patient Instructions (Addendum)
Continue clopidogrel 75 mg daily  and lipitor  for secondary stroke prevention  Continue to follow up with PCP regarding cholesterol and blood pressure management   The eye condition you have is nystagmus which is likely a result from your stroke causing you to have continued balance and coordination issues - referral placed for physical and occupation therapies to assist with compensation techniques   Continue to monitor blood pressure at home  Maintain strict control of hypertension with blood pressure goal below 130/90, diabetes with hemoglobin A1c goal below 6.5% and cholesterol with LDL cholesterol (bad cholesterol) goal below 70 mg/dL. I also advised the patient to eat a healthy diet with plenty of whole grains, cereals, fruits and vegetables, exercise regularly and maintain ideal body weight.  Followup in the future with me as needed or call earlier if needed       Thank you for coming to see [place] at [facility]. I hope we have been able to provide you high quality care today.  You may receive a patient satisfaction survey over the next few weeks. We would appreciate your feedback and comments so that we may continue to improve ourselves and the health of our patients.

## 2018-10-10 DIAGNOSIS — F418 Other specified anxiety disorders: Secondary | ICD-10-CM | POA: Diagnosis not present

## 2018-10-10 DIAGNOSIS — I10 Essential (primary) hypertension: Secondary | ICD-10-CM | POA: Diagnosis not present

## 2018-10-10 DIAGNOSIS — Z6825 Body mass index (BMI) 25.0-25.9, adult: Secondary | ICD-10-CM | POA: Diagnosis not present

## 2018-10-10 DIAGNOSIS — I639 Cerebral infarction, unspecified: Secondary | ICD-10-CM | POA: Diagnosis not present

## 2018-10-10 DIAGNOSIS — E7849 Other hyperlipidemia: Secondary | ICD-10-CM | POA: Diagnosis not present

## 2018-10-24 ENCOUNTER — Encounter: Payer: Self-pay | Admitting: Adult Health

## 2018-10-24 DIAGNOSIS — G4700 Insomnia, unspecified: Secondary | ICD-10-CM

## 2018-10-24 DIAGNOSIS — I10 Essential (primary) hypertension: Secondary | ICD-10-CM

## 2018-10-24 DIAGNOSIS — I639 Cerebral infarction, unspecified: Secondary | ICD-10-CM

## 2018-10-25 ENCOUNTER — Other Ambulatory Visit: Payer: Self-pay | Admitting: Adult Health

## 2018-10-25 DIAGNOSIS — I639 Cerebral infarction, unspecified: Secondary | ICD-10-CM

## 2018-10-30 ENCOUNTER — Ambulatory Visit (INDEPENDENT_AMBULATORY_CARE_PROVIDER_SITE_OTHER): Payer: Medicare Other | Admitting: Neurology

## 2018-10-30 ENCOUNTER — Encounter: Payer: Self-pay | Admitting: Neurology

## 2018-10-30 VITALS — BP 122/81 | HR 66 | Ht 64.0 in | Wt 153.0 lb

## 2018-10-30 DIAGNOSIS — G4733 Obstructive sleep apnea (adult) (pediatric): Secondary | ICD-10-CM

## 2018-10-30 DIAGNOSIS — G4731 Primary central sleep apnea: Secondary | ICD-10-CM | POA: Diagnosis not present

## 2018-10-30 DIAGNOSIS — R0689 Other abnormalities of breathing: Secondary | ICD-10-CM

## 2018-10-30 DIAGNOSIS — I6381 Other cerebral infarction due to occlusion or stenosis of small artery: Secondary | ICD-10-CM

## 2018-10-30 DIAGNOSIS — I639 Cerebral infarction, unspecified: Secondary | ICD-10-CM

## 2018-10-30 NOTE — Progress Notes (Signed)
**Note Omar-Identified via Obfuscation** SLEEP MEDICINE CLINIC   Provider:  Larey Seat, MD   Primary Care Physician:  Prince Solian, MD   Referring Provider: Prince Solian, MD    Chief Complaint  Patient presents with  . New Patient (Initial Visit)    pt alone, rm 10. pt states that he wakes up freqently during the night gasping for air. wakes up with dry mouth during the night. sleep study was completed 2016 through [facility]. pt quit using CPAP around the time of the sleep study. patient had a stroke 2 yrs ago.     HPI:  Omar Reed is a 68 y.o. male patient of Dr. Toy Cookey and Sethi's seen on 10-20-2018.  He is seen here upon request of Dr. Augustina Mood, DDS-   Omar Reed was previously diagnosed with obstructive sleep apnea ; his diagnosis was established after a sleep test at [facility], the attending physician was [name], the patient underwent a sleep study on 05 December 2014 had a total sleep time of 375 minutes, 2 prolonged REM sleep phases, no periodic limb movements his AHI was very mild at the time 5 events per hour with moderate snoring and only transient oxygen desaturation.  He left the degree of sleep apnea based treatment to the patient. Omar Reed suffered a stroke in 2018 and has recovered very well since.  He does have an ophthalmological problem that has persisted all bite in a milder form.  He has used prisms for a while to help him refocus his gaze but he still has some peripheral misperception or lack of perception of movement.  He has noticed this especially of the squash court.   Chief complaint according to patient : "I have a dry mouth, and wake up many times. I gasp for air out of sleep. I am almost panicked by this struggle for air. I wake up."     Sleep habits are as follows: He eats lunch around noon and supper around 5 PM, his bedtime will be around 10 or 11 PM. The bedroom is relatively cool, quiet and dark.  The patient prefers to sleep on his right side, he uses one pillow for  head support and one between the knees.  He is usually asleep within 15 minutes or less.  He does wake up at about 130 and then again between 3 and 4 AM.  These arousals are infrequently related to air hunger ; he notes a feeling of being strangled or choked, but mostly related to a dry mouth.  He is seen here upon request of Dr. Augustina Mood, DDS-   Omar Reed was previously diagnosed with obstructive sleep apnea ; his diagnosis was established after a sleep test at [facility], the attending physician was [name], the patient underwent a sleep study on 05 December 2014 had a total sleep time of 375 minutes, 2 prolonged REM sleep phases, no periodic limb movements his AHI was very mild at the time 5 events per hour with moderate snoring and only transient oxygen desaturation.  He left the degree of sleep apnea based treatment to the patient. He can't recall dreams. No restless legs. Wife reports him snoring.  His wife is leaving the bedroom at 5.15 in order to exercise in the morning hours and he often wakes up but is not ready to wake up. He would like to sleep through until 6.30 AM. He feels his sleep is not refreshing and not restorative. He feels his sleep is  too fragmented.   He naps rarely in daytime- but recalls power naps being refreshing at 20 - 30 minute duration.  Sinusitis , chronic. Nasal patency is often restricted. GERD.  Dr Leonie Man: medical history: 10/09/18 .post stroke deficits of diplopia and dizziness.  He continues on Plavix without side effects of bleeding or bruising.  He will occasionally have difficulty with coordination/balance such as when he also hit the ball or if he is going down steps.  He denies any visual difficulties such as double or blurred vision but does endorse increased difficulty with seeing at night. Denies new or worsening stroke/TIA symptoms.  Summary history: Omar Reed is a 68 year old male seen today for the first office follow-up visit following  hospital admission for stroke in January 2018.10/06/2017: he returns for follow-up after last visit 4 months ago.He states is doing well without recurrent stroke or TIA symptoms. He complains of mild positional dizziness when he gets up suddenly. This does not last long feels a sensation of imbalance rather than true vertigo or nausea. He states his double vision has improved but is still present to a mild degree. He has occasional blurred vision as well. He has tried wearing new glasses which has not helped. He remains on Plavix which is tolerating well without bleeding or bruising. States his blood pressure is under good control and today it is 143/95. He is tolerating Lipitor well without muscle aches and pains. Patient is scheduled to undergo bladder surgery by Dr. Diona Fanti and is asking for neurological clearance.he states his hand tremors have improved.  Family sleep history:  No family history.no sleepwalking history.   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 Wife still working.  Omar Reed has never been a smoker or used any other form of tobacco. Seldomly using alcohol, caffeine use the patient does not consume sodas or coffee but occasionally tea.     Review of Systems: Out of a complete 14 system review, the patient complains of only the following symptoms, and all other reviewed systems are negative.  Epworth score : 5/ 24  , Fatigue severity score 42/ 63 (!) please copy to sleep study history , depression score: 3/ 15.   Social History   Socioeconomic History  . Marital status: Married    Spouse name: Not on file  . Number of children: 2  . Years of education: Not on file  . Highest education level: Not on file  Occupational History  . Occupation: retired  Scientific laboratory technician  . Financial resource strain: Not on file  . Food insecurity:    Worry: Not on file    Inability: Not on file  . Transportation needs:    Medical: Not on file    Non-medical: Not on file   Tobacco Use  . Smoking status: Never Smoker  . Smokeless tobacco: Never Used  Substance and Sexual Activity  . Alcohol use: No    Alcohol/week: 0.0 standard drinks  . Drug use: No  . Sexual activity: Not on file  Lifestyle  . Physical activity:    Days per week: Not on file    Minutes per session: Not on file  . Stress: Not on file  Relationships  . Social connections:    Talks on phone: Not on file    Gets together: Not on file    Attends religious service: Not on file    Active member of club or organization: Not on file    Attends meetings of clubs  or organizations: Not on file    Relationship status: Not on file  . Intimate partner violence:    Fear of current or ex partner: Not on file    Emotionally abused: Not on file    Physically abused: Not on file    Forced sexual activity: Not on file  Other Topics Concern  . Not on file  Social History Narrative  . Not on file    Family History  Problem Relation Age of Onset  . CAD Mother     Past Medical History:  Diagnosis Date  . Anxiety   . Hypertension   . Stroke [facility])     Past Surgical History:  Procedure Laterality Date  . APPENDECTOMY    . NASAL SINUS SURGERY      Current Outpatient Medications  Medication Sig Dispense Refill  . amLODipine (NORVASC) 5 MG tablet     . atorvastatin (LIPITOR) 40 MG tablet Take 40 mg by mouth.     . clopidogrel (PLAVIX) 75 MG tablet Take by mouth.    . finasteride (PROPECIA) 1 MG tablet Take 1 mg by mouth daily.    . irbesartan (AVAPRO) 150 MG tablet Take 150 mg by mouth daily after lunch.    . ranitidine (ZANTAC) 150 MG tablet Take by mouth.    . Vitamins/Minerals TABS Take by mouth.     No current facility-administered medications for this visit.     Allergies as of 10/30/2018 - Review Complete 10/30/2018  Allergen Reaction Noted  . Griseofulvin Other (See Comments) 09/12/2016  . Nitrofuran derivatives  10/30/2018    Vitals: BP 122/81   Pulse 66   Ht 5\' 4"   (1.626 m)   Wt 153 lb (69.4 kg)   BMI 26.26 kg/m  Last Weight:  Wt Readings from Last 1 Encounters:  10/30/18 153 lb (69.4 kg)   TDV:VOHY mass index is 26.26 kg/m.     Last Height:   Ht Readings from Last 1 Encounters:  10/30/18 5\' 4"  (1.626 m)    Physical exam:  General: The patient is awake, alert and appears not in acute distress. The patient is well groomed. Head: Normocephalic, atraumatic. Neck is supple.  Mallampati 4,  neck circumference: 14. 25 "   . Nasal airflow congestion present. ,Retrognathia is seen.   Cardiovascular:  Regular rate and rhythm  without  murmurs or carotid bruit, and without distended neck veins. Respiratory: Lungs are clear to auscultation. Skin:  Without evidence of edema, or rash Trunk: BMI is . The patient's posture is relaxed.    Neurologic exam : The patient is awake and alert, oriented to place and time.     Attention span & concentration ability appears normal.  Speech is fluent,  without dysarthria, dysphonia or aphasia.  Mood and affect are appropriate.  Cranial nerves: Pupils are equal and briskly reactive to light. Extraocular movements  in vertical and horizontal planes intact and without nystagmus. Visual fields by finger perimetry are intact. Hearing to finger rub intact.  Facial sensation intact to fine touch.  Facial motor strength is symmetric and tongue and uvula move midline. Shoulder shrug was symmetrical.   Motor exam:  Normal tone, muscle bulk and symmetric strength in all extremities. Sensory: Fine touch, pinprick and vibration were tested in all extremities. Proprioception tested in the upper extremities was normal.  Coordination: Rapid alternating movements in the fingers/hands was normal. Finger-to-nose maneuver  normal without evidence of ataxia, dysmetria or tremor.  Gait and station: Patient walks  without assistive device. He has problems with going down steps.   Deep tendon reflexes: in the upper and lower  extremities are symmetric and intact.    Assessment:  After physical and neurologic examination, review of laboratory studies,  Personal review of imaging studies, reports of other /same  Imaging studies, results of polysomnography and / or neurophysiology testing and pre-existing records as far as provided in visit., my assessment is   1)  Patient will need another attended sleep study based on his high risk for central apnea.  His OSA risk is reduced after weight loss, he has a smaller neck size and he reports gasping , suddenly awakening out of sleep, not so much snoring.   2) he has high degree of fatigue, unexplained.   3) retrognathia- he could have used a dental device, but needs a lab to confirm the need.    The patient was advised of the nature of the diagnosed disorder , the treatment options and the  risks for general health and wellness arising from not treating the condition.   I spent more than 45  minutes of face to face time with the patient.  Greater than 50% of time was spent in counseling and coordination of care. We have discussed the diagnosis and differential and I answered the patient's questions.    Plan:  Treatment plan and additional workup : Attended sleep study to evalaute for sleep apnea in a patient with high risk for central apnea.   Larey Seat, MD 9/89/2119, 4:17 PM  Certified in Neurology by ABPN Certified in [place] by [facility] [address] [place], [place] [address]

## 2018-11-07 DIAGNOSIS — H0102A Squamous blepharitis right eye, upper and lower eyelids: Secondary | ICD-10-CM | POA: Diagnosis not present

## 2018-11-07 DIAGNOSIS — H2513 Age-related nuclear cataract, bilateral: Secondary | ICD-10-CM | POA: Diagnosis not present

## 2018-11-07 DIAGNOSIS — H00024 Hordeolum internum left upper eyelid: Secondary | ICD-10-CM | POA: Diagnosis not present

## 2018-11-07 DIAGNOSIS — H0102B Squamous blepharitis left eye, upper and lower eyelids: Secondary | ICD-10-CM | POA: Diagnosis not present

## 2018-11-12 ENCOUNTER — Ambulatory Visit (INDEPENDENT_AMBULATORY_CARE_PROVIDER_SITE_OTHER): Payer: Medicare Other | Admitting: Neurology

## 2018-11-12 DIAGNOSIS — R0689 Other abnormalities of breathing: Secondary | ICD-10-CM

## 2018-11-12 DIAGNOSIS — G4731 Primary central sleep apnea: Secondary | ICD-10-CM | POA: Diagnosis not present

## 2018-11-12 DIAGNOSIS — I6381 Other cerebral infarction due to occlusion or stenosis of small artery: Secondary | ICD-10-CM

## 2018-11-13 ENCOUNTER — Encounter: Payer: Self-pay | Admitting: Neurology

## 2018-11-13 NOTE — Procedures (Signed)
PATIENT'S NAME:  Omar Reed, Hollerbach:      1951/03/27      Omar#:    [id number]     DATE OF RECORDING: 11/12/2018 REFERRING M.D.:  Augustina Reed, DDS, and Dr. Leonie Man, MD Study Performed:   Baseline Polysomnogram HISTORY:  Omar Reed is a 68 y.o. male patient of Omar Reed and Sethi's seen on 10-20-2018. Chief complaint according to patient: "I have a dry mouth, and wake up from sleep many times. I gasp for air out of sleep. I am almost panicked by this struggle for air. I wake up."  Omar Reed wife reports him snoring.  He would like to sleep through until 6.30 AM. He feels his sleep is not refreshing and not restorative. He feels his sleep is too fragmented.  He naps rarely in daytime- but recalls power naps being refreshing at 20 - 30 minute duration. Has chronic sinusitis. Nasal patency is often restricted. GERD.    He is seen here upon request of Omar Reed, DDS-   Omar. Reed was previously diagnosed with obstructive sleep apnea by Omar Reed. The patient underwent sleep study on 05 December 2014; had a total sleep time of 375 minutes, 2 prolonged REM sleep phases, no periodic limb movements, and his AHI was very mild at 5 / hour with moderate snoring and only transient oxygen desaturation.  He left the treatment to the patient based on mild degree of sleep apnea.  Omar Reed then suffered a stroke in 2018 and has recovered very well since. I quoted Omar Reed visit from 10/09/18: "post stroke deficits of diplopia and dizziness.  He continues on Plavix without side effects of bleeding or bruising.  He will occasionally have difficulty with coordination/balance such as when he also hit the ball or if he is going down steps. He denies any visual difficulties such as double or blurred vision but does endorse increased difficulty with seeing at night. Denies new or worsening stroke/TIA symptoms".   The patient endorsed the Epworth Sleepiness Scale at 5 points, the FSS at 42/ 63 points.   The  patient's weight 152 pounds with a height of 64 (inches), resulting in a BMI of 26.0 kg/m2. The patient's neck circumference measured 16.5 inches.  CURRENT MEDICATIONS: Norvasc, Lipitor, Propecia, Avapro, Zantac,   PROCEDURE:  This is a multichannel digital polysomnogram utilizing the Somnostar 11.2 system.  Electrodes and sensors were applied and monitored per AASM Specifications.   EEG, EOG, Chin and Limb EMG, were sampled at 200 Hz.  ECG, Snore and Nasal Pressure, Thermal Airflow, Respiratory Effort, CPAP Flow and Pressure, Oximetry was sampled at 50 Hz. Digital video and audio were recorded.      BASELINE STUDY: Lights Out was at 22:28 and Lights On at 04:59.  Total recording time (TRT) was 391.5 minutes, with a total sleep time (TST) of 249 minutes.   The patient's sleep latency was 67.5 minutes.  REM latency was 125 minutes.  The sleep efficiency was 63.6 %.     SLEEP ARCHITECTURE: WASO (Wake after sleep onset) was 135 minutes.  There were 29 minutes in Stage N1, 87 minutes Stage N2, 97 minutes Stage N3 and 36 minutes in Stage REM.  The percentage of Stage N1 was 11.6%, Stage N2 was 34.9%, Sleep -Stage N3 was 39.0 %, and Stage R (REM sleep) was 14.5%.   RESPIRATORY ANALYSIS:  There were a total of 12 respiratory events:  0 obstructive apneas, 2 central apneas and 0 mixed apneas  with 10 hypopneas. The patient also had 1 respiratory event related arousal (RERAs). The total APNEA/HYPOPNEA INDEX (AHI) was 2.9 /hour and the total RESPIRATORY DISTURBANCE INDEX was 3.1 /hour.  0 events occurred in REM sleep and 22 events in NREM. The REM AHI was 0. 0 /hour, versus a non-REM AHI of 3.4. The patient spent 148 minutes of total sleep time in the supine position and 101 minutes in non-supine. The supine AHI was 4.9/h versus a non-supine AHI of 0.0/h.  OXYGEN SATURATION & C02:  The Wake baseline 02 saturation was 94%, with the lowest being 80%. Time spent below 89% saturation equaled 6 minutes.   PERIODIC  LIMB MOVEMENTS:  The arousals were noted as: 46 were spontaneous, 0 were associated with PLMs, and 10 were associated with respiratory events. The patient had a total of 0 Periodic Limb Movements.    Audio and video analysis did not show any abnormal or unusual movements, behaviors, phonations or vocalizations.  Full muscle tone drop out during REM sleep noted. Louder snoring during REM sleep noted.  No nocturia.  EKG was in keeping with normal sinus rhythm (NSR) and isolated extra beats, bradycardia and PVCs- see screen shot attached.  Post-study, the patient indicated that sleep was the same as usual.    IMPRESSION:  1. Primary Snoring in lateral and supine position. 2. One prolonged period of wakefulness from 2.15 through 3.30 AM following a spontaneous arousal. Poor sleep efficiency. 3. Isolated Central Hypopneas were noted, not having an impact on sleep architecture and breathing pattern.   RECOMMENDATIONS: There is no clinically significant Apnea. Snoring treatment by dental device is optional, but a PAP therapy indication is not given.   Improvement of nasal patency may be the single most helpful step to better sleep and less snoring.  I found no physiological cause for the long periods of wakefulness, and referring physician/ PCP may consider Buspar or Trazodone for sleep improvement.      I certify that I have reviewed the entire raw data recording prior to the issuance of this report in accordance with the Standards of Accreditation of the American Academy of Sleep Medicine (AASM)   Omar Seat, MD     11-13-2018 Diplomat, American Board of Psychiatry and Neurology  Diplomat, American Board of [place] Director, [place] [facility] [employer]

## 2018-11-15 DIAGNOSIS — H0102B Squamous blepharitis left eye, upper and lower eyelids: Secondary | ICD-10-CM | POA: Diagnosis not present

## 2018-11-15 DIAGNOSIS — H43812 Vitreous degeneration, left eye: Secondary | ICD-10-CM | POA: Diagnosis not present

## 2018-11-15 DIAGNOSIS — I63139 Cerebral infarction due to embolism of unspecified carotid artery: Secondary | ICD-10-CM | POA: Diagnosis not present

## 2018-11-15 DIAGNOSIS — H00024 Hordeolum internum left upper eyelid: Secondary | ICD-10-CM | POA: Diagnosis not present

## 2018-11-15 DIAGNOSIS — H2513 Age-related nuclear cataract, bilateral: Secondary | ICD-10-CM | POA: Diagnosis not present

## 2018-11-15 DIAGNOSIS — H0102A Squamous blepharitis right eye, upper and lower eyelids: Secondary | ICD-10-CM | POA: Diagnosis not present

## 2019-04-26 DIAGNOSIS — Z125 Encounter for screening for malignant neoplasm of prostate: Secondary | ICD-10-CM | POA: Diagnosis not present

## 2019-04-26 DIAGNOSIS — E7849 Other hyperlipidemia: Secondary | ICD-10-CM | POA: Diagnosis not present

## 2019-04-27 DIAGNOSIS — I10 Essential (primary) hypertension: Secondary | ICD-10-CM | POA: Diagnosis not present

## 2019-04-27 DIAGNOSIS — R82998 Other abnormal findings in urine: Secondary | ICD-10-CM | POA: Diagnosis not present

## 2019-05-01 DIAGNOSIS — K219 Gastro-esophageal reflux disease without esophagitis: Secondary | ICD-10-CM | POA: Diagnosis not present

## 2019-05-01 DIAGNOSIS — I639 Cerebral infarction, unspecified: Secondary | ICD-10-CM | POA: Diagnosis not present

## 2019-05-01 DIAGNOSIS — I10 Essential (primary) hypertension: Secondary | ICD-10-CM | POA: Diagnosis not present

## 2019-05-01 DIAGNOSIS — Z1331 Encounter for screening for depression: Secondary | ICD-10-CM | POA: Diagnosis not present

## 2019-05-01 DIAGNOSIS — I519 Heart disease, unspecified: Secondary | ICD-10-CM | POA: Diagnosis not present

## 2019-05-01 DIAGNOSIS — G4733 Obstructive sleep apnea (adult) (pediatric): Secondary | ICD-10-CM | POA: Diagnosis not present

## 2019-05-01 DIAGNOSIS — E785 Hyperlipidemia, unspecified: Secondary | ICD-10-CM | POA: Diagnosis not present

## 2019-05-01 DIAGNOSIS — F419 Anxiety disorder, unspecified: Secondary | ICD-10-CM | POA: Diagnosis not present

## 2019-05-01 DIAGNOSIS — J309 Allergic rhinitis, unspecified: Secondary | ICD-10-CM | POA: Diagnosis not present

## 2019-05-01 DIAGNOSIS — R946 Abnormal results of thyroid function studies: Secondary | ICD-10-CM | POA: Diagnosis not present

## 2019-05-01 DIAGNOSIS — Z0000 Encounter for general adult medical examination without abnormal findings: Secondary | ICD-10-CM | POA: Diagnosis not present

## 2019-05-01 DIAGNOSIS — N3281 Overactive bladder: Secondary | ICD-10-CM | POA: Diagnosis not present

## 2019-05-25 DIAGNOSIS — S60569A Insect bite (nonvenomous) of unspecified hand, initial encounter: Secondary | ICD-10-CM | POA: Diagnosis not present

## 2019-10-03 ENCOUNTER — Ambulatory Visit: Payer: Medicare Other

## 2019-10-12 ENCOUNTER — Ambulatory Visit: Payer: Medicare Other | Attending: Internal Medicine

## 2019-10-12 DIAGNOSIS — Z23 Encounter for immunization: Secondary | ICD-10-CM

## 2019-10-12 NOTE — Progress Notes (Signed)
   Covid-19 Vaccination Clinic  Name:  Osceola Koe Lerette    MRN: [id number] DOB: 1950-11-18  10/12/2019  Mr. Fabrizio was observed post Covid-19 immunization for 15 minutes without incidence. He was provided with Vaccine Information Sheet and instruction to access the V-Safe system.   Mr. Pulver was instructed to call 911 with any severe reactions post vaccine: Marland Kitchen Difficulty breathing  . Swelling of your face and throat  . A fast heartbeat  . A bad rash all over your body  . Dizziness and weakness    Immunizations Administered    Name Date Dose VIS Date Route   Pfizer COVID-19 Vaccine 10/12/2019 12:42 PM 0.3 mL 08/17/2019 Intramuscular   Manufacturer: [place]   Lot: [id number]   [place]: [id number]

## 2019-10-19 DIAGNOSIS — F419 Anxiety disorder, unspecified: Secondary | ICD-10-CM | POA: Diagnosis not present

## 2019-10-19 DIAGNOSIS — E785 Hyperlipidemia, unspecified: Secondary | ICD-10-CM | POA: Diagnosis not present

## 2019-10-19 DIAGNOSIS — R946 Abnormal results of thyroid function studies: Secondary | ICD-10-CM | POA: Diagnosis not present

## 2019-10-19 DIAGNOSIS — I10 Essential (primary) hypertension: Secondary | ICD-10-CM | POA: Diagnosis not present

## 2019-10-19 DIAGNOSIS — N3281 Overactive bladder: Secondary | ICD-10-CM | POA: Diagnosis not present

## 2019-10-19 DIAGNOSIS — I639 Cerebral infarction, unspecified: Secondary | ICD-10-CM | POA: Diagnosis not present

## 2019-10-19 DIAGNOSIS — G4733 Obstructive sleep apnea (adult) (pediatric): Secondary | ICD-10-CM | POA: Diagnosis not present

## 2019-11-06 ENCOUNTER — Ambulatory Visit: Payer: Medicare Other | Attending: Internal Medicine

## 2019-11-06 DIAGNOSIS — Z23 Encounter for immunization: Secondary | ICD-10-CM | POA: Insufficient documentation

## 2019-11-06 NOTE — Progress Notes (Signed)
   Covid-19 Vaccination Clinic  Name:  Omar Reed    MRN: [id number] DOB: 09-19-1950  11/06/2019  [name] was observed post Covid-19 immunization for 15 minutes without incident. He was provided with Vaccine Information Sheet and instruction to access the V-Safe system.   [name] was instructed to call 911 with any severe reactions post vaccine: Marland Kitchen Difficulty breathing  . Swelling of face and throat  . A fast heartbeat  . A bad rash all over body  . Dizziness and weakness   Immunizations Administered    Name Date Dose VIS Date Route   Pfizer COVID-19 Vaccine 11/06/2019 12:55 PM 0.3 mL 08/17/2019 Intramuscular   Manufacturer: [place]   Lot: [id number]   [place]: [id number]

## 2019-11-13 DIAGNOSIS — H00022 Hordeolum internum right lower eyelid: Secondary | ICD-10-CM | POA: Diagnosis not present

## 2019-11-13 DIAGNOSIS — H2513 Age-related nuclear cataract, bilateral: Secondary | ICD-10-CM | POA: Diagnosis not present

## 2019-11-19 DIAGNOSIS — H0102A Squamous blepharitis right eye, upper and lower eyelids: Secondary | ICD-10-CM | POA: Diagnosis not present

## 2019-11-19 DIAGNOSIS — I63139 Cerebral infarction due to embolism of unspecified carotid artery: Secondary | ICD-10-CM | POA: Diagnosis not present

## 2019-11-19 DIAGNOSIS — H2513 Age-related nuclear cataract, bilateral: Secondary | ICD-10-CM | POA: Diagnosis not present

## 2019-11-19 DIAGNOSIS — H43812 Vitreous degeneration, left eye: Secondary | ICD-10-CM | POA: Diagnosis not present

## 2019-11-19 DIAGNOSIS — H0102B Squamous blepharitis left eye, upper and lower eyelids: Secondary | ICD-10-CM | POA: Diagnosis not present

## 2019-11-22 DIAGNOSIS — H2512 Age-related nuclear cataract, left eye: Secondary | ICD-10-CM | POA: Diagnosis not present

## 2019-11-30 DIAGNOSIS — H2511 Age-related nuclear cataract, right eye: Secondary | ICD-10-CM | POA: Diagnosis not present

## 2019-12-06 DIAGNOSIS — H2511 Age-related nuclear cataract, right eye: Secondary | ICD-10-CM | POA: Diagnosis not present

## 2019-12-20 DIAGNOSIS — J300 Vasomotor rhinitis: Secondary | ICD-10-CM | POA: Diagnosis not present

## 2019-12-20 DIAGNOSIS — J301 Allergic rhinitis due to pollen: Secondary | ICD-10-CM | POA: Diagnosis not present

## 2019-12-20 DIAGNOSIS — J3089 Other allergic rhinitis: Secondary | ICD-10-CM | POA: Diagnosis not present

## 2019-12-20 DIAGNOSIS — J3081 Allergic rhinitis due to animal (cat) (dog) hair and dander: Secondary | ICD-10-CM | POA: Diagnosis not present

## 2020-01-02 DIAGNOSIS — J301 Allergic rhinitis due to pollen: Secondary | ICD-10-CM | POA: Diagnosis not present

## 2020-01-02 DIAGNOSIS — J3089 Other allergic rhinitis: Secondary | ICD-10-CM | POA: Diagnosis not present

## 2020-01-02 DIAGNOSIS — J3081 Allergic rhinitis due to animal (cat) (dog) hair and dander: Secondary | ICD-10-CM | POA: Diagnosis not present

## 2020-01-03 ENCOUNTER — Ambulatory Visit (INDEPENDENT_AMBULATORY_CARE_PROVIDER_SITE_OTHER): Payer: Medicare Other

## 2020-01-03 ENCOUNTER — Other Ambulatory Visit: Payer: Self-pay

## 2020-01-03 ENCOUNTER — Ambulatory Visit (INDEPENDENT_AMBULATORY_CARE_PROVIDER_SITE_OTHER): Payer: Medicare Other | Admitting: Podiatry

## 2020-01-03 ENCOUNTER — Encounter: Payer: Self-pay | Admitting: Podiatry

## 2020-01-03 ENCOUNTER — Other Ambulatory Visit: Payer: Self-pay | Admitting: Podiatry

## 2020-01-03 DIAGNOSIS — Q828 Other specified congenital malformations of skin: Secondary | ICD-10-CM

## 2020-01-03 DIAGNOSIS — M79671 Pain in right foot: Secondary | ICD-10-CM | POA: Diagnosis not present

## 2020-01-03 DIAGNOSIS — M779 Enthesopathy, unspecified: Secondary | ICD-10-CM | POA: Diagnosis not present

## 2020-01-03 DIAGNOSIS — G8929 Other chronic pain: Secondary | ICD-10-CM

## 2020-01-03 DIAGNOSIS — M778 Other enthesopathies, not elsewhere classified: Secondary | ICD-10-CM | POA: Diagnosis not present

## 2020-01-03 NOTE — Progress Notes (Signed)
Subjective:   Patient ID: Early Chars Douty, male   DOB: 69 y.o.   MRN: [id number]   HPI Patient presents stating I developed these lesions on the bottom of both feet and on trying to walk a lot and they get sore and make it hard for me to be active.  States is been going on more recently and patient currently does not smoke likes to be active   ROS      Objective:  Physical Exam Vitals and nursing note reviewed.  Constitutional:      Appearance: He is well-developed.  Pulmonary:     Effort: Pulmonary effort is normal.  Musculoskeletal:        General: Normal range of motion.  Skin:    General: Skin is warm.  Neurological:     Mental Status: He is alert.     Neurovascular status found to be intact muscle strength was found to be adequate patient found to have lucent cord lesions plantar aspect both feet with inflammation of the lesser MPJ right.  They are painful when pressed make shoe gear and walking difficult     Assessment:  Inflammatory capsulitis with porokeratotic type lesions     Plan:  H&P reviewed conditions discussed treatment and at this point I recommended debridement with no iatrogenic bleeding and this will be done as needed and we discussed other conditions do not recommend treatment for them encouraged to call with questions  X-rays indicate there is some irritation plantar tissue but I did not note any stress fracture or advanced type arthritic condition

## 2020-02-06 DIAGNOSIS — J301 Allergic rhinitis due to pollen: Secondary | ICD-10-CM | POA: Diagnosis not present

## 2020-02-06 DIAGNOSIS — J3089 Other allergic rhinitis: Secondary | ICD-10-CM | POA: Diagnosis not present

## 2020-02-06 DIAGNOSIS — J3081 Allergic rhinitis due to animal (cat) (dog) hair and dander: Secondary | ICD-10-CM | POA: Diagnosis not present

## 2020-02-11 DIAGNOSIS — J3081 Allergic rhinitis due to animal (cat) (dog) hair and dander: Secondary | ICD-10-CM | POA: Diagnosis not present

## 2020-02-11 DIAGNOSIS — J3089 Other allergic rhinitis: Secondary | ICD-10-CM | POA: Diagnosis not present

## 2020-02-11 DIAGNOSIS — J301 Allergic rhinitis due to pollen: Secondary | ICD-10-CM | POA: Diagnosis not present

## 2020-02-14 DIAGNOSIS — J3081 Allergic rhinitis due to animal (cat) (dog) hair and dander: Secondary | ICD-10-CM | POA: Diagnosis not present

## 2020-02-14 DIAGNOSIS — J301 Allergic rhinitis due to pollen: Secondary | ICD-10-CM | POA: Diagnosis not present

## 2020-02-14 DIAGNOSIS — J3089 Other allergic rhinitis: Secondary | ICD-10-CM | POA: Diagnosis not present

## 2020-03-19 DIAGNOSIS — J301 Allergic rhinitis due to pollen: Secondary | ICD-10-CM | POA: Diagnosis not present

## 2020-03-19 DIAGNOSIS — J3081 Allergic rhinitis due to animal (cat) (dog) hair and dander: Secondary | ICD-10-CM | POA: Diagnosis not present

## 2020-03-19 DIAGNOSIS — J3089 Other allergic rhinitis: Secondary | ICD-10-CM | POA: Diagnosis not present

## 2020-03-21 DIAGNOSIS — J3081 Allergic rhinitis due to animal (cat) (dog) hair and dander: Secondary | ICD-10-CM | POA: Diagnosis not present

## 2020-03-21 DIAGNOSIS — J3089 Other allergic rhinitis: Secondary | ICD-10-CM | POA: Diagnosis not present

## 2020-03-21 DIAGNOSIS — J301 Allergic rhinitis due to pollen: Secondary | ICD-10-CM | POA: Diagnosis not present

## 2020-03-24 DIAGNOSIS — J3081 Allergic rhinitis due to animal (cat) (dog) hair and dander: Secondary | ICD-10-CM | POA: Diagnosis not present

## 2020-03-24 DIAGNOSIS — J301 Allergic rhinitis due to pollen: Secondary | ICD-10-CM | POA: Diagnosis not present

## 2020-03-24 DIAGNOSIS — J3089 Other allergic rhinitis: Secondary | ICD-10-CM | POA: Diagnosis not present

## 2020-03-26 DIAGNOSIS — J3081 Allergic rhinitis due to animal (cat) (dog) hair and dander: Secondary | ICD-10-CM | POA: Diagnosis not present

## 2020-03-26 DIAGNOSIS — J301 Allergic rhinitis due to pollen: Secondary | ICD-10-CM | POA: Diagnosis not present

## 2020-03-26 DIAGNOSIS — J3089 Other allergic rhinitis: Secondary | ICD-10-CM | POA: Diagnosis not present

## 2020-04-02 DIAGNOSIS — J3081 Allergic rhinitis due to animal (cat) (dog) hair and dander: Secondary | ICD-10-CM | POA: Diagnosis not present

## 2020-04-02 DIAGNOSIS — J3089 Other allergic rhinitis: Secondary | ICD-10-CM | POA: Diagnosis not present

## 2020-04-02 DIAGNOSIS — J301 Allergic rhinitis due to pollen: Secondary | ICD-10-CM | POA: Diagnosis not present

## 2020-04-04 DIAGNOSIS — J3089 Other allergic rhinitis: Secondary | ICD-10-CM | POA: Diagnosis not present

## 2020-04-04 DIAGNOSIS — J3081 Allergic rhinitis due to animal (cat) (dog) hair and dander: Secondary | ICD-10-CM | POA: Diagnosis not present

## 2020-04-04 DIAGNOSIS — J301 Allergic rhinitis due to pollen: Secondary | ICD-10-CM | POA: Diagnosis not present

## 2020-04-07 DIAGNOSIS — J3089 Other allergic rhinitis: Secondary | ICD-10-CM | POA: Diagnosis not present

## 2020-04-07 DIAGNOSIS — J3081 Allergic rhinitis due to animal (cat) (dog) hair and dander: Secondary | ICD-10-CM | POA: Diagnosis not present

## 2020-04-07 DIAGNOSIS — J301 Allergic rhinitis due to pollen: Secondary | ICD-10-CM | POA: Diagnosis not present

## 2020-04-10 DIAGNOSIS — J3089 Other allergic rhinitis: Secondary | ICD-10-CM | POA: Diagnosis not present

## 2020-04-10 DIAGNOSIS — J3081 Allergic rhinitis due to animal (cat) (dog) hair and dander: Secondary | ICD-10-CM | POA: Diagnosis not present

## 2020-04-10 DIAGNOSIS — J301 Allergic rhinitis due to pollen: Secondary | ICD-10-CM | POA: Diagnosis not present

## 2020-04-15 DIAGNOSIS — J3081 Allergic rhinitis due to animal (cat) (dog) hair and dander: Secondary | ICD-10-CM | POA: Diagnosis not present

## 2020-04-15 DIAGNOSIS — J301 Allergic rhinitis due to pollen: Secondary | ICD-10-CM | POA: Diagnosis not present

## 2020-04-15 DIAGNOSIS — J3089 Other allergic rhinitis: Secondary | ICD-10-CM | POA: Diagnosis not present

## 2020-04-18 DIAGNOSIS — J3089 Other allergic rhinitis: Secondary | ICD-10-CM | POA: Diagnosis not present

## 2020-04-18 DIAGNOSIS — J3081 Allergic rhinitis due to animal (cat) (dog) hair and dander: Secondary | ICD-10-CM | POA: Diagnosis not present

## 2020-04-18 DIAGNOSIS — J301 Allergic rhinitis due to pollen: Secondary | ICD-10-CM | POA: Diagnosis not present

## 2020-04-24 DIAGNOSIS — J3089 Other allergic rhinitis: Secondary | ICD-10-CM | POA: Diagnosis not present

## 2020-04-24 DIAGNOSIS — J301 Allergic rhinitis due to pollen: Secondary | ICD-10-CM | POA: Diagnosis not present

## 2020-04-24 DIAGNOSIS — J3081 Allergic rhinitis due to animal (cat) (dog) hair and dander: Secondary | ICD-10-CM | POA: Diagnosis not present

## 2020-05-01 DIAGNOSIS — J3089 Other allergic rhinitis: Secondary | ICD-10-CM | POA: Diagnosis not present

## 2020-05-01 DIAGNOSIS — J301 Allergic rhinitis due to pollen: Secondary | ICD-10-CM | POA: Diagnosis not present

## 2020-05-01 DIAGNOSIS — J3081 Allergic rhinitis due to animal (cat) (dog) hair and dander: Secondary | ICD-10-CM | POA: Diagnosis not present

## 2020-05-05 DIAGNOSIS — J3081 Allergic rhinitis due to animal (cat) (dog) hair and dander: Secondary | ICD-10-CM | POA: Diagnosis not present

## 2020-05-05 DIAGNOSIS — J3089 Other allergic rhinitis: Secondary | ICD-10-CM | POA: Diagnosis not present

## 2020-05-05 DIAGNOSIS — J301 Allergic rhinitis due to pollen: Secondary | ICD-10-CM | POA: Diagnosis not present

## 2020-05-06 DIAGNOSIS — H0102A Squamous blepharitis right eye, upper and lower eyelids: Secondary | ICD-10-CM | POA: Diagnosis not present

## 2020-05-06 DIAGNOSIS — H0102B Squamous blepharitis left eye, upper and lower eyelids: Secondary | ICD-10-CM | POA: Diagnosis not present

## 2020-05-06 DIAGNOSIS — H43813 Vitreous degeneration, bilateral: Secondary | ICD-10-CM | POA: Diagnosis not present

## 2020-05-06 DIAGNOSIS — H26493 Other secondary cataract, bilateral: Secondary | ICD-10-CM | POA: Diagnosis not present

## 2020-05-06 DIAGNOSIS — Z961 Presence of intraocular lens: Secondary | ICD-10-CM | POA: Diagnosis not present

## 2020-05-06 DIAGNOSIS — I63139 Cerebral infarction due to embolism of unspecified carotid artery: Secondary | ICD-10-CM | POA: Diagnosis not present

## 2020-05-08 DIAGNOSIS — J301 Allergic rhinitis due to pollen: Secondary | ICD-10-CM | POA: Diagnosis not present

## 2020-05-08 DIAGNOSIS — J3089 Other allergic rhinitis: Secondary | ICD-10-CM | POA: Diagnosis not present

## 2020-05-08 DIAGNOSIS — J3081 Allergic rhinitis due to animal (cat) (dog) hair and dander: Secondary | ICD-10-CM | POA: Diagnosis not present

## 2020-05-15 DIAGNOSIS — Z03818 Encounter for observation for suspected exposure to other biological agents ruled out: Secondary | ICD-10-CM | POA: Diagnosis not present

## 2020-05-15 DIAGNOSIS — J3081 Allergic rhinitis due to animal (cat) (dog) hair and dander: Secondary | ICD-10-CM | POA: Diagnosis not present

## 2020-05-15 DIAGNOSIS — J3089 Other allergic rhinitis: Secondary | ICD-10-CM | POA: Diagnosis not present

## 2020-05-15 DIAGNOSIS — J301 Allergic rhinitis due to pollen: Secondary | ICD-10-CM | POA: Diagnosis not present

## 2020-05-21 DIAGNOSIS — E785 Hyperlipidemia, unspecified: Secondary | ICD-10-CM | POA: Diagnosis not present

## 2020-05-21 DIAGNOSIS — I10 Essential (primary) hypertension: Secondary | ICD-10-CM | POA: Diagnosis not present

## 2020-05-21 DIAGNOSIS — J3081 Allergic rhinitis due to animal (cat) (dog) hair and dander: Secondary | ICD-10-CM | POA: Diagnosis not present

## 2020-05-21 DIAGNOSIS — R946 Abnormal results of thyroid function studies: Secondary | ICD-10-CM | POA: Diagnosis not present

## 2020-05-21 DIAGNOSIS — Z125 Encounter for screening for malignant neoplasm of prostate: Secondary | ICD-10-CM | POA: Diagnosis not present

## 2020-05-21 DIAGNOSIS — J3089 Other allergic rhinitis: Secondary | ICD-10-CM | POA: Diagnosis not present

## 2020-05-21 DIAGNOSIS — J301 Allergic rhinitis due to pollen: Secondary | ICD-10-CM | POA: Diagnosis not present

## 2020-05-28 DIAGNOSIS — R946 Abnormal results of thyroid function studies: Secondary | ICD-10-CM | POA: Diagnosis not present

## 2020-05-28 DIAGNOSIS — G4733 Obstructive sleep apnea (adult) (pediatric): Secondary | ICD-10-CM | POA: Diagnosis not present

## 2020-05-28 DIAGNOSIS — K219 Gastro-esophageal reflux disease without esophagitis: Secondary | ICD-10-CM | POA: Diagnosis not present

## 2020-05-28 DIAGNOSIS — R82998 Other abnormal findings in urine: Secondary | ICD-10-CM | POA: Diagnosis not present

## 2020-05-28 DIAGNOSIS — F419 Anxiety disorder, unspecified: Secondary | ICD-10-CM | POA: Diagnosis not present

## 2020-05-28 DIAGNOSIS — N3281 Overactive bladder: Secondary | ICD-10-CM | POA: Diagnosis not present

## 2020-05-28 DIAGNOSIS — I639 Cerebral infarction, unspecified: Secondary | ICD-10-CM | POA: Diagnosis not present

## 2020-05-28 DIAGNOSIS — Z23 Encounter for immunization: Secondary | ICD-10-CM | POA: Diagnosis not present

## 2020-05-28 DIAGNOSIS — I519 Heart disease, unspecified: Secondary | ICD-10-CM | POA: Diagnosis not present

## 2020-05-28 DIAGNOSIS — J309 Allergic rhinitis, unspecified: Secondary | ICD-10-CM | POA: Diagnosis not present

## 2020-05-28 DIAGNOSIS — Z0000 Encounter for general adult medical examination without abnormal findings: Secondary | ICD-10-CM | POA: Diagnosis not present

## 2020-05-28 DIAGNOSIS — E785 Hyperlipidemia, unspecified: Secondary | ICD-10-CM | POA: Diagnosis not present

## 2020-05-28 DIAGNOSIS — I10 Essential (primary) hypertension: Secondary | ICD-10-CM | POA: Diagnosis not present

## 2020-05-30 DIAGNOSIS — J3081 Allergic rhinitis due to animal (cat) (dog) hair and dander: Secondary | ICD-10-CM | POA: Diagnosis not present

## 2020-05-30 DIAGNOSIS — J301 Allergic rhinitis due to pollen: Secondary | ICD-10-CM | POA: Diagnosis not present

## 2020-05-30 DIAGNOSIS — J3089 Other allergic rhinitis: Secondary | ICD-10-CM | POA: Diagnosis not present

## 2020-06-02 DIAGNOSIS — J3081 Allergic rhinitis due to animal (cat) (dog) hair and dander: Secondary | ICD-10-CM | POA: Diagnosis not present

## 2020-06-02 DIAGNOSIS — J301 Allergic rhinitis due to pollen: Secondary | ICD-10-CM | POA: Diagnosis not present

## 2020-06-02 DIAGNOSIS — J3089 Other allergic rhinitis: Secondary | ICD-10-CM | POA: Diagnosis not present

## 2020-06-04 DIAGNOSIS — Z23 Encounter for immunization: Secondary | ICD-10-CM | POA: Diagnosis not present

## 2020-06-10 DIAGNOSIS — D225 Melanocytic nevi of trunk: Secondary | ICD-10-CM | POA: Diagnosis not present

## 2020-06-10 DIAGNOSIS — D2272 Melanocytic nevi of left lower limb, including hip: Secondary | ICD-10-CM | POA: Diagnosis not present

## 2020-06-10 DIAGNOSIS — L84 Corns and callosities: Secondary | ICD-10-CM | POA: Diagnosis not present

## 2020-06-10 DIAGNOSIS — L218 Other seborrheic dermatitis: Secondary | ICD-10-CM | POA: Diagnosis not present

## 2020-06-10 DIAGNOSIS — D1801 Hemangioma of skin and subcutaneous tissue: Secondary | ICD-10-CM | POA: Diagnosis not present

## 2020-06-10 DIAGNOSIS — L821 Other seborrheic keratosis: Secondary | ICD-10-CM | POA: Diagnosis not present

## 2020-06-11 DIAGNOSIS — J301 Allergic rhinitis due to pollen: Secondary | ICD-10-CM | POA: Diagnosis not present

## 2020-06-11 DIAGNOSIS — J3089 Other allergic rhinitis: Secondary | ICD-10-CM | POA: Diagnosis not present

## 2020-06-11 DIAGNOSIS — J3081 Allergic rhinitis due to animal (cat) (dog) hair and dander: Secondary | ICD-10-CM | POA: Diagnosis not present

## 2020-06-12 ENCOUNTER — Ambulatory Visit: Payer: 59 | Admitting: Podiatry

## 2020-06-13 DIAGNOSIS — J301 Allergic rhinitis due to pollen: Secondary | ICD-10-CM | POA: Diagnosis not present

## 2020-06-13 DIAGNOSIS — J3089 Other allergic rhinitis: Secondary | ICD-10-CM | POA: Diagnosis not present

## 2020-06-13 DIAGNOSIS — J3081 Allergic rhinitis due to animal (cat) (dog) hair and dander: Secondary | ICD-10-CM | POA: Diagnosis not present

## 2020-06-17 DIAGNOSIS — R350 Frequency of micturition: Secondary | ICD-10-CM | POA: Diagnosis not present

## 2020-06-17 DIAGNOSIS — R351 Nocturia: Secondary | ICD-10-CM | POA: Diagnosis not present

## 2020-06-17 DIAGNOSIS — N5201 Erectile dysfunction due to arterial insufficiency: Secondary | ICD-10-CM | POA: Diagnosis not present

## 2020-06-17 DIAGNOSIS — J301 Allergic rhinitis due to pollen: Secondary | ICD-10-CM | POA: Diagnosis not present

## 2020-06-17 DIAGNOSIS — J3081 Allergic rhinitis due to animal (cat) (dog) hair and dander: Secondary | ICD-10-CM | POA: Diagnosis not present

## 2020-06-17 DIAGNOSIS — J3089 Other allergic rhinitis: Secondary | ICD-10-CM | POA: Diagnosis not present

## 2020-06-17 DIAGNOSIS — N401 Enlarged prostate with lower urinary tract symptoms: Secondary | ICD-10-CM | POA: Diagnosis not present

## 2020-06-19 DIAGNOSIS — J301 Allergic rhinitis due to pollen: Secondary | ICD-10-CM | POA: Diagnosis not present

## 2020-06-19 DIAGNOSIS — J3089 Other allergic rhinitis: Secondary | ICD-10-CM | POA: Diagnosis not present

## 2020-06-19 DIAGNOSIS — J3081 Allergic rhinitis due to animal (cat) (dog) hair and dander: Secondary | ICD-10-CM | POA: Diagnosis not present

## 2020-06-25 DIAGNOSIS — J3081 Allergic rhinitis due to animal (cat) (dog) hair and dander: Secondary | ICD-10-CM | POA: Diagnosis not present

## 2020-06-25 DIAGNOSIS — J3089 Other allergic rhinitis: Secondary | ICD-10-CM | POA: Diagnosis not present

## 2020-06-25 DIAGNOSIS — J301 Allergic rhinitis due to pollen: Secondary | ICD-10-CM | POA: Diagnosis not present

## 2020-07-02 DIAGNOSIS — J301 Allergic rhinitis due to pollen: Secondary | ICD-10-CM | POA: Diagnosis not present

## 2020-07-02 DIAGNOSIS — J3089 Other allergic rhinitis: Secondary | ICD-10-CM | POA: Diagnosis not present

## 2020-07-02 DIAGNOSIS — J3081 Allergic rhinitis due to animal (cat) (dog) hair and dander: Secondary | ICD-10-CM | POA: Diagnosis not present

## 2020-07-08 DIAGNOSIS — I63139 Cerebral infarction due to embolism of unspecified carotid artery: Secondary | ICD-10-CM | POA: Diagnosis not present

## 2020-07-08 DIAGNOSIS — H26492 Other secondary cataract, left eye: Secondary | ICD-10-CM | POA: Diagnosis not present

## 2020-07-08 DIAGNOSIS — Z961 Presence of intraocular lens: Secondary | ICD-10-CM | POA: Diagnosis not present

## 2020-07-08 DIAGNOSIS — H0102A Squamous blepharitis right eye, upper and lower eyelids: Secondary | ICD-10-CM | POA: Diagnosis not present

## 2020-07-08 DIAGNOSIS — H43813 Vitreous degeneration, bilateral: Secondary | ICD-10-CM | POA: Diagnosis not present

## 2020-07-08 DIAGNOSIS — H0102B Squamous blepharitis left eye, upper and lower eyelids: Secondary | ICD-10-CM | POA: Diagnosis not present

## 2020-07-09 DIAGNOSIS — J3089 Other allergic rhinitis: Secondary | ICD-10-CM | POA: Diagnosis not present

## 2020-07-09 DIAGNOSIS — J3081 Allergic rhinitis due to animal (cat) (dog) hair and dander: Secondary | ICD-10-CM | POA: Diagnosis not present

## 2020-07-09 DIAGNOSIS — J301 Allergic rhinitis due to pollen: Secondary | ICD-10-CM | POA: Diagnosis not present

## 2020-07-17 DIAGNOSIS — J3089 Other allergic rhinitis: Secondary | ICD-10-CM | POA: Diagnosis not present

## 2020-07-17 DIAGNOSIS — J3081 Allergic rhinitis due to animal (cat) (dog) hair and dander: Secondary | ICD-10-CM | POA: Diagnosis not present

## 2020-07-17 DIAGNOSIS — J301 Allergic rhinitis due to pollen: Secondary | ICD-10-CM | POA: Diagnosis not present

## 2020-07-24 DIAGNOSIS — J3089 Other allergic rhinitis: Secondary | ICD-10-CM | POA: Diagnosis not present

## 2020-07-24 DIAGNOSIS — J3081 Allergic rhinitis due to animal (cat) (dog) hair and dander: Secondary | ICD-10-CM | POA: Diagnosis not present

## 2020-07-24 DIAGNOSIS — J301 Allergic rhinitis due to pollen: Secondary | ICD-10-CM | POA: Diagnosis not present

## 2020-07-28 DIAGNOSIS — J3089 Other allergic rhinitis: Secondary | ICD-10-CM | POA: Diagnosis not present

## 2020-07-28 DIAGNOSIS — J3081 Allergic rhinitis due to animal (cat) (dog) hair and dander: Secondary | ICD-10-CM | POA: Diagnosis not present

## 2020-07-28 DIAGNOSIS — J301 Allergic rhinitis due to pollen: Secondary | ICD-10-CM | POA: Diagnosis not present

## 2020-08-06 DIAGNOSIS — J3089 Other allergic rhinitis: Secondary | ICD-10-CM | POA: Diagnosis not present

## 2020-08-06 DIAGNOSIS — J3081 Allergic rhinitis due to animal (cat) (dog) hair and dander: Secondary | ICD-10-CM | POA: Diagnosis not present

## 2020-08-06 DIAGNOSIS — J301 Allergic rhinitis due to pollen: Secondary | ICD-10-CM | POA: Diagnosis not present

## 2020-08-12 DIAGNOSIS — J301 Allergic rhinitis due to pollen: Secondary | ICD-10-CM | POA: Diagnosis not present

## 2020-08-12 DIAGNOSIS — Z86010 Personal history of colonic polyps: Secondary | ICD-10-CM | POA: Diagnosis not present

## 2020-08-12 DIAGNOSIS — K2100 Gastro-esophageal reflux disease with esophagitis, without bleeding: Secondary | ICD-10-CM | POA: Diagnosis not present

## 2020-08-12 DIAGNOSIS — J3089 Other allergic rhinitis: Secondary | ICD-10-CM | POA: Diagnosis not present

## 2020-08-12 DIAGNOSIS — K644 Residual hemorrhoidal skin tags: Secondary | ICD-10-CM | POA: Diagnosis not present

## 2020-08-12 DIAGNOSIS — J3081 Allergic rhinitis due to animal (cat) (dog) hair and dander: Secondary | ICD-10-CM | POA: Diagnosis not present

## 2020-08-19 DIAGNOSIS — J301 Allergic rhinitis due to pollen: Secondary | ICD-10-CM | POA: Diagnosis not present

## 2020-08-19 DIAGNOSIS — J3081 Allergic rhinitis due to animal (cat) (dog) hair and dander: Secondary | ICD-10-CM | POA: Diagnosis not present

## 2020-08-19 DIAGNOSIS — J300 Vasomotor rhinitis: Secondary | ICD-10-CM | POA: Diagnosis not present

## 2020-08-19 DIAGNOSIS — J3089 Other allergic rhinitis: Secondary | ICD-10-CM | POA: Diagnosis not present

## 2020-09-12 DIAGNOSIS — Z20822 Contact with and (suspected) exposure to covid-19: Secondary | ICD-10-CM | POA: Diagnosis not present

## 2020-09-17 DIAGNOSIS — J301 Allergic rhinitis due to pollen: Secondary | ICD-10-CM | POA: Diagnosis not present

## 2020-09-17 DIAGNOSIS — J3089 Other allergic rhinitis: Secondary | ICD-10-CM | POA: Diagnosis not present

## 2020-09-24 DIAGNOSIS — J301 Allergic rhinitis due to pollen: Secondary | ICD-10-CM | POA: Diagnosis not present

## 2020-09-24 DIAGNOSIS — J3089 Other allergic rhinitis: Secondary | ICD-10-CM | POA: Diagnosis not present

## 2020-09-24 DIAGNOSIS — J3081 Allergic rhinitis due to animal (cat) (dog) hair and dander: Secondary | ICD-10-CM | POA: Diagnosis not present

## 2020-10-03 DIAGNOSIS — J301 Allergic rhinitis due to pollen: Secondary | ICD-10-CM | POA: Diagnosis not present

## 2020-10-03 DIAGNOSIS — J3089 Other allergic rhinitis: Secondary | ICD-10-CM | POA: Diagnosis not present

## 2020-10-03 DIAGNOSIS — Z01812 Encounter for preprocedural laboratory examination: Secondary | ICD-10-CM | POA: Diagnosis not present

## 2020-10-03 DIAGNOSIS — J3081 Allergic rhinitis due to animal (cat) (dog) hair and dander: Secondary | ICD-10-CM | POA: Diagnosis not present

## 2020-10-06 DIAGNOSIS — J3081 Allergic rhinitis due to animal (cat) (dog) hair and dander: Secondary | ICD-10-CM | POA: Diagnosis not present

## 2020-10-06 DIAGNOSIS — J301 Allergic rhinitis due to pollen: Secondary | ICD-10-CM | POA: Diagnosis not present

## 2020-10-06 DIAGNOSIS — J3089 Other allergic rhinitis: Secondary | ICD-10-CM | POA: Diagnosis not present

## 2020-10-08 DIAGNOSIS — K3189 Other diseases of stomach and duodenum: Secondary | ICD-10-CM | POA: Diagnosis not present

## 2020-10-08 DIAGNOSIS — K5730 Diverticulosis of large intestine without perforation or abscess without bleeding: Secondary | ICD-10-CM | POA: Diagnosis not present

## 2020-10-08 DIAGNOSIS — K269 Duodenal ulcer, unspecified as acute or chronic, without hemorrhage or perforation: Secondary | ICD-10-CM | POA: Diagnosis not present

## 2020-10-08 DIAGNOSIS — K449 Diaphragmatic hernia without obstruction or gangrene: Secondary | ICD-10-CM | POA: Diagnosis not present

## 2020-10-08 DIAGNOSIS — K219 Gastro-esophageal reflux disease without esophagitis: Secondary | ICD-10-CM | POA: Diagnosis not present

## 2020-10-08 DIAGNOSIS — K317 Polyp of stomach and duodenum: Secondary | ICD-10-CM | POA: Diagnosis not present

## 2020-10-08 DIAGNOSIS — Z86010 Personal history of colonic polyps: Secondary | ICD-10-CM | POA: Diagnosis not present

## 2020-10-10 DIAGNOSIS — K317 Polyp of stomach and duodenum: Secondary | ICD-10-CM | POA: Diagnosis not present

## 2020-10-15 DIAGNOSIS — J3089 Other allergic rhinitis: Secondary | ICD-10-CM | POA: Diagnosis not present

## 2020-10-15 DIAGNOSIS — J3081 Allergic rhinitis due to animal (cat) (dog) hair and dander: Secondary | ICD-10-CM | POA: Diagnosis not present

## 2020-10-15 DIAGNOSIS — J301 Allergic rhinitis due to pollen: Secondary | ICD-10-CM | POA: Diagnosis not present

## 2020-10-21 DIAGNOSIS — J3081 Allergic rhinitis due to animal (cat) (dog) hair and dander: Secondary | ICD-10-CM | POA: Diagnosis not present

## 2020-10-21 DIAGNOSIS — J301 Allergic rhinitis due to pollen: Secondary | ICD-10-CM | POA: Diagnosis not present

## 2020-10-21 DIAGNOSIS — J3089 Other allergic rhinitis: Secondary | ICD-10-CM | POA: Diagnosis not present

## 2020-10-27 DIAGNOSIS — J301 Allergic rhinitis due to pollen: Secondary | ICD-10-CM | POA: Diagnosis not present

## 2020-10-27 DIAGNOSIS — J3081 Allergic rhinitis due to animal (cat) (dog) hair and dander: Secondary | ICD-10-CM | POA: Diagnosis not present

## 2020-10-27 DIAGNOSIS — J3089 Other allergic rhinitis: Secondary | ICD-10-CM | POA: Diagnosis not present

## 2020-11-03 DIAGNOSIS — J301 Allergic rhinitis due to pollen: Secondary | ICD-10-CM | POA: Diagnosis not present

## 2020-11-03 DIAGNOSIS — J3081 Allergic rhinitis due to animal (cat) (dog) hair and dander: Secondary | ICD-10-CM | POA: Diagnosis not present

## 2020-11-03 DIAGNOSIS — J3089 Other allergic rhinitis: Secondary | ICD-10-CM | POA: Diagnosis not present

## 2020-11-04 DIAGNOSIS — J301 Allergic rhinitis due to pollen: Secondary | ICD-10-CM | POA: Diagnosis not present

## 2020-11-04 DIAGNOSIS — J3081 Allergic rhinitis due to animal (cat) (dog) hair and dander: Secondary | ICD-10-CM | POA: Diagnosis not present

## 2020-11-04 DIAGNOSIS — J3089 Other allergic rhinitis: Secondary | ICD-10-CM | POA: Diagnosis not present

## 2020-11-13 DIAGNOSIS — J301 Allergic rhinitis due to pollen: Secondary | ICD-10-CM | POA: Diagnosis not present

## 2020-11-13 DIAGNOSIS — J3089 Other allergic rhinitis: Secondary | ICD-10-CM | POA: Diagnosis not present

## 2020-11-13 DIAGNOSIS — J3081 Allergic rhinitis due to animal (cat) (dog) hair and dander: Secondary | ICD-10-CM | POA: Diagnosis not present

## 2020-11-18 DIAGNOSIS — J3081 Allergic rhinitis due to animal (cat) (dog) hair and dander: Secondary | ICD-10-CM | POA: Diagnosis not present

## 2020-11-18 DIAGNOSIS — J301 Allergic rhinitis due to pollen: Secondary | ICD-10-CM | POA: Diagnosis not present

## 2020-11-18 DIAGNOSIS — J3089 Other allergic rhinitis: Secondary | ICD-10-CM | POA: Diagnosis not present

## 2020-11-25 DIAGNOSIS — F419 Anxiety disorder, unspecified: Secondary | ICD-10-CM | POA: Diagnosis not present

## 2020-11-25 DIAGNOSIS — G4733 Obstructive sleep apnea (adult) (pediatric): Secondary | ICD-10-CM | POA: Diagnosis not present

## 2020-11-25 DIAGNOSIS — J3089 Other allergic rhinitis: Secondary | ICD-10-CM | POA: Diagnosis not present

## 2020-11-25 DIAGNOSIS — R946 Abnormal results of thyroid function studies: Secondary | ICD-10-CM | POA: Diagnosis not present

## 2020-11-25 DIAGNOSIS — I10 Essential (primary) hypertension: Secondary | ICD-10-CM | POA: Diagnosis not present

## 2020-11-25 DIAGNOSIS — I519 Heart disease, unspecified: Secondary | ICD-10-CM | POA: Diagnosis not present

## 2020-11-25 DIAGNOSIS — N3281 Overactive bladder: Secondary | ICD-10-CM | POA: Diagnosis not present

## 2020-11-25 DIAGNOSIS — J301 Allergic rhinitis due to pollen: Secondary | ICD-10-CM | POA: Diagnosis not present

## 2020-11-25 DIAGNOSIS — E785 Hyperlipidemia, unspecified: Secondary | ICD-10-CM | POA: Diagnosis not present

## 2020-11-25 DIAGNOSIS — I639 Cerebral infarction, unspecified: Secondary | ICD-10-CM | POA: Diagnosis not present

## 2020-11-25 DIAGNOSIS — Z1509 Genetic susceptibility to other malignant neoplasm: Secondary | ICD-10-CM | POA: Diagnosis not present

## 2020-11-25 DIAGNOSIS — J3081 Allergic rhinitis due to animal (cat) (dog) hair and dander: Secondary | ICD-10-CM | POA: Diagnosis not present

## 2020-11-25 DIAGNOSIS — K219 Gastro-esophageal reflux disease without esophagitis: Secondary | ICD-10-CM | POA: Diagnosis not present

## 2020-12-02 DIAGNOSIS — J3081 Allergic rhinitis due to animal (cat) (dog) hair and dander: Secondary | ICD-10-CM | POA: Diagnosis not present

## 2020-12-02 DIAGNOSIS — J301 Allergic rhinitis due to pollen: Secondary | ICD-10-CM | POA: Diagnosis not present

## 2020-12-02 DIAGNOSIS — J3089 Other allergic rhinitis: Secondary | ICD-10-CM | POA: Diagnosis not present

## 2020-12-09 DIAGNOSIS — Z23 Encounter for immunization: Secondary | ICD-10-CM | POA: Diagnosis not present

## 2020-12-12 DIAGNOSIS — N401 Enlarged prostate with lower urinary tract symptoms: Secondary | ICD-10-CM | POA: Diagnosis not present

## 2020-12-12 DIAGNOSIS — R972 Elevated prostate specific antigen [PSA]: Secondary | ICD-10-CM | POA: Diagnosis not present

## 2020-12-12 DIAGNOSIS — R3914 Feeling of incomplete bladder emptying: Secondary | ICD-10-CM | POA: Diagnosis not present

## 2020-12-12 DIAGNOSIS — N5201 Erectile dysfunction due to arterial insufficiency: Secondary | ICD-10-CM | POA: Diagnosis not present

## 2020-12-16 DIAGNOSIS — J301 Allergic rhinitis due to pollen: Secondary | ICD-10-CM | POA: Diagnosis not present

## 2020-12-16 DIAGNOSIS — J3089 Other allergic rhinitis: Secondary | ICD-10-CM | POA: Diagnosis not present

## 2020-12-16 DIAGNOSIS — J3081 Allergic rhinitis due to animal (cat) (dog) hair and dander: Secondary | ICD-10-CM | POA: Diagnosis not present

## 2020-12-25 DIAGNOSIS — J3081 Allergic rhinitis due to animal (cat) (dog) hair and dander: Secondary | ICD-10-CM | POA: Diagnosis not present

## 2020-12-25 DIAGNOSIS — J301 Allergic rhinitis due to pollen: Secondary | ICD-10-CM | POA: Diagnosis not present

## 2020-12-25 DIAGNOSIS — J3089 Other allergic rhinitis: Secondary | ICD-10-CM | POA: Diagnosis not present

## 2021-01-01 DIAGNOSIS — J3081 Allergic rhinitis due to animal (cat) (dog) hair and dander: Secondary | ICD-10-CM | POA: Diagnosis not present

## 2021-01-01 DIAGNOSIS — J3089 Other allergic rhinitis: Secondary | ICD-10-CM | POA: Diagnosis not present

## 2021-01-01 DIAGNOSIS — J301 Allergic rhinitis due to pollen: Secondary | ICD-10-CM | POA: Diagnosis not present

## 2021-01-19 DIAGNOSIS — K649 Unspecified hemorrhoids: Secondary | ICD-10-CM | POA: Diagnosis not present

## 2021-01-23 DIAGNOSIS — Z20822 Contact with and (suspected) exposure to covid-19: Secondary | ICD-10-CM | POA: Diagnosis not present

## 2024-07-11 ENCOUNTER — Telehealth: Payer: Self-pay | Admitting: Neurology

## 2024-07-11 NOTE — Telephone Encounter (Signed)
 Patient left voicemail on 07/10/24 at 10:21 am. Wandering if I could ask for your help please. Finding it frustrating to get a call from Medical Records. Do not know if under staffed or just do not care. If you could get them this message to get a call back to help take care of my needs.   Forwarded message to Medical Records.
